# Patient Record
Sex: Female | Born: 1965 | Race: White | Hispanic: No | Marital: Married | Smoking: Never smoker
Health system: Southern US, Community
[De-identification: ages and names within clinical notes are randomized; demographics above are authoritative.]

## PROBLEM LIST (undated history)

## (undated) DIAGNOSIS — M533 Sacrococcygeal disorders, not elsewhere classified: Secondary | ICD-10-CM

## (undated) DIAGNOSIS — N390 Urinary tract infection, site not specified: Secondary | ICD-10-CM

## (undated) DIAGNOSIS — F419 Anxiety disorder, unspecified: Secondary | ICD-10-CM

## (undated) HISTORY — DX: Anxiety disorder, unspecified: F41.9

## (undated) HISTORY — PX: PILONIDAL CYST EXCISION: SHX744

## (undated) HISTORY — PX: BREAST BIOPSY: SHX20

---

## 2005-04-28 ENCOUNTER — Ambulatory Visit: Payer: Self-pay | Admitting: Cardiology

## 2005-05-15 ENCOUNTER — Ambulatory Visit: Payer: Self-pay | Admitting: Cardiology

## 2005-05-26 ENCOUNTER — Ambulatory Visit: Payer: Self-pay | Admitting: Cardiology

## 2005-06-22 ENCOUNTER — Ambulatory Visit: Payer: Self-pay | Admitting: Cardiology

## 2005-06-30 ENCOUNTER — Ambulatory Visit: Payer: Self-pay | Admitting: Cardiology

## 2005-06-30 ENCOUNTER — Inpatient Hospital Stay (HOSPITAL_BASED_OUTPATIENT_CLINIC_OR_DEPARTMENT_OTHER): Admission: RE | Admit: 2005-06-30 | Discharge: 2005-06-30 | Payer: Self-pay | Admitting: Cardiology

## 2005-07-15 ENCOUNTER — Ambulatory Visit: Payer: Self-pay | Admitting: Cardiology

## 2008-07-05 ENCOUNTER — Encounter: Admission: RE | Admit: 2008-07-05 | Discharge: 2008-07-05 | Payer: Self-pay | Admitting: Obstetrics and Gynecology

## 2009-08-30 ENCOUNTER — Encounter: Admission: RE | Admit: 2009-08-30 | Discharge: 2009-08-30 | Payer: Self-pay | Admitting: Obstetrics and Gynecology

## 2010-02-21 ENCOUNTER — Other Ambulatory Visit: Payer: Self-pay | Admitting: Obstetrics and Gynecology

## 2010-02-21 ENCOUNTER — Ambulatory Visit
Admission: RE | Admit: 2010-02-21 | Discharge: 2010-02-21 | Disposition: A | Discharge status: Home / Self Care | Payer: BC Managed Care – PPO | Source: Ambulatory Visit | Attending: Obstetrics and Gynecology | Admitting: Obstetrics and Gynecology

## 2010-02-21 DIAGNOSIS — N644 Mastodynia: Secondary | ICD-10-CM

## 2010-05-23 NOTE — Assessment & Plan Note (Signed)
[facility]                            [facility] OFFICE NOTE   Betty Harvey, Betty Harvey                        MRN:          [id number]  DATE:07/15/2005                            DOB:          1965/02/24    HISTORY OF PRESENT ILLNESS:  The patient is a 45 year old female who  recently underwent cardiac catheterization.  The patient was found to have  normal coronary arteries, normal left ventricular function.  The patient  prior to that had a Cardiolite study done, which appeared in retrospect to  be false/positive.  The initial reason for the Cardiolite was a markedly  elevated C-reactive protein obtained by Dr. Gilford Silvius.  The patient reportedly  had told Dr. Gilford Silvius that she had longstanding history of substernal chest  pain.  The patient states that even after catheterization she keeps having  ongoing chest pain, although it is not exertional.  She states there is a  sharp pain in the left side of the chest which is intermittent.  She denies  any shortness of breath, orthopnea, PND, palpitations, or syncope.   The patient is not currently taking medications.   PHYSICAL EXAMINATION:  VITAL SIGNS:  Stable, with a blood pressure of 82/54,  heart is 80 beats per minute, she weighs 150 pounds.  NECK:  Reveals a normal carotid upstroke and no carotid bruits.  LUNGS:  Clear bilaterally.  CARDIAC:  Reveals regular rate and rhythm.  Normal S1, S2, no murmurs, rubs,  or gallops.  ABDOMEN:  Soft.  EXTREMITIES:  No cyanosis, clubbing, or edema.   PROBLEM LIST:  1.  Elevated C-reactive protein.  2.  False/positive exercise Cardiolite stress study.  3.  Normal catheterization.  4.  Substernal chest pain of  unclear etiology.      1.  Normal chest x-ray with normal osseous structures and soft tissues          in the left chest.   PLAN:  1.  At this point in time, the patient can follow up with her primary care      physician.  I do not think she has a  cardiac etiology to her chest pain.  2.  I did recommend to follow up on her C-reactive protein, but I suspect      this was elevated possibly secondary to an underlying infection or      rather nonspecific reasons.                                  Learta Codding, MD, [facility]   GED/MedQ  DD:  07/16/2005  DT:  07/16/2005  Job #:  [id number]   cc:   Sheela Stack, MD

## 2010-05-23 NOTE — Cardiovascular Report (Signed)
[name], [name]:  [id number]   MEDICAL RECORD NO.:  [id number]          PATIENT TYPE:  OIB   LOCATION:  1962                         FACILITY:  [facility]   PHYSICIAN:  Rollene Rotunda, M.D.   DATE OF BIRTH:  Jan 28, 1965   DATE OF PROCEDURE:  06/30/2005  DATE OF DISCHARGE:                              CARDIAC CATHETERIZATION   PRIMARY:  Betty Harvey.   CARDIOLOGIST:  Willa Rough, M.D.   PROCEDURE:  Left heart catheterization/coronary angiography.   INDICATIONS:  Patient with chest pain and a Cardiolite suggesting anterior  ischemia.   PROCEDURE NOTE:  Left heart catheterization was performed via the right  femoral artery.  The artery was cannulated using anterior wall puncture.  A  #4 French arterial sheath was inserted via the modified Seldinger technique.  Preformed Judkins and pigtail catheter were utilized.  The patient tolerated  the procedure well and left the lab in stable condition.   RESULTS:  Hemodynamics:  LV 98/8, ___________ 16/10.   Coronaries:  The left main was normal.  The LAD was normal.  The circumflex  was essentially a large obtuse marginal which was normal.  The right  coronary artery was a large dominant vessel and normal.  The PDA was very  large and normal.  There was a small posterolateral which was normal.   Left ventriculogram:  The left ventriculogram was obtained in the RAO  projection.  The EF was 65% with normal wall motion.   CONCLUSION:  Normal coronaries.  Normal left ventricular function.   PLAN:  No further cardiac workup is suggested.  The patient will follow up  with her primary care doctor for evaluation of non-anginal chest pain.           ______________________________  Rollene Rotunda, M.D.     JH/MEDQ  D:  06/30/2005  T:  06/30/2005  Job:  [id number]   cc:   Willa Rough, M.D.  [address]  [address]  [place]  [place] [address]   Betty Harvey  Fax: [phone]

## 2010-06-25 ENCOUNTER — Other Ambulatory Visit: Payer: Self-pay | Admitting: Obstetrics and Gynecology

## 2010-06-25 DIAGNOSIS — Z1231 Encounter for screening mammogram for malignant neoplasm of breast: Secondary | ICD-10-CM

## 2010-09-04 ENCOUNTER — Ambulatory Visit: Payer: BC Managed Care – PPO

## 2010-09-18 ENCOUNTER — Ambulatory Visit
Admission: RE | Admit: 2010-09-18 | Discharge: 2010-09-18 | Disposition: A | Discharge status: Home / Self Care | Payer: BC Managed Care – PPO | Source: Ambulatory Visit | Attending: Obstetrics and Gynecology | Admitting: Obstetrics and Gynecology

## 2010-09-18 DIAGNOSIS — Z1231 Encounter for screening mammogram for malignant neoplasm of breast: Secondary | ICD-10-CM

## 2010-09-18 IMAGING — MG MM DIGITAL SCREENING {BCG}
4 series · 4 of 4 positions shown · non-contrast
Comparison: none

DG SCREEN MAMMOGRAM BILATERAL
Bilateral CC and MLO view(s) were taken.
Technologist: GILLERMO

DIGITAL SCREENING MAMMOGRAM WITH CAD:
The breast tissue is heterogeneously dense.  Possible masses are noted in both breasts.  Spot 
compression views and possibly sonography are recommended for further evaluation.  .  Compared with
prior studies.
Images were processed with CAD.

[R CC]
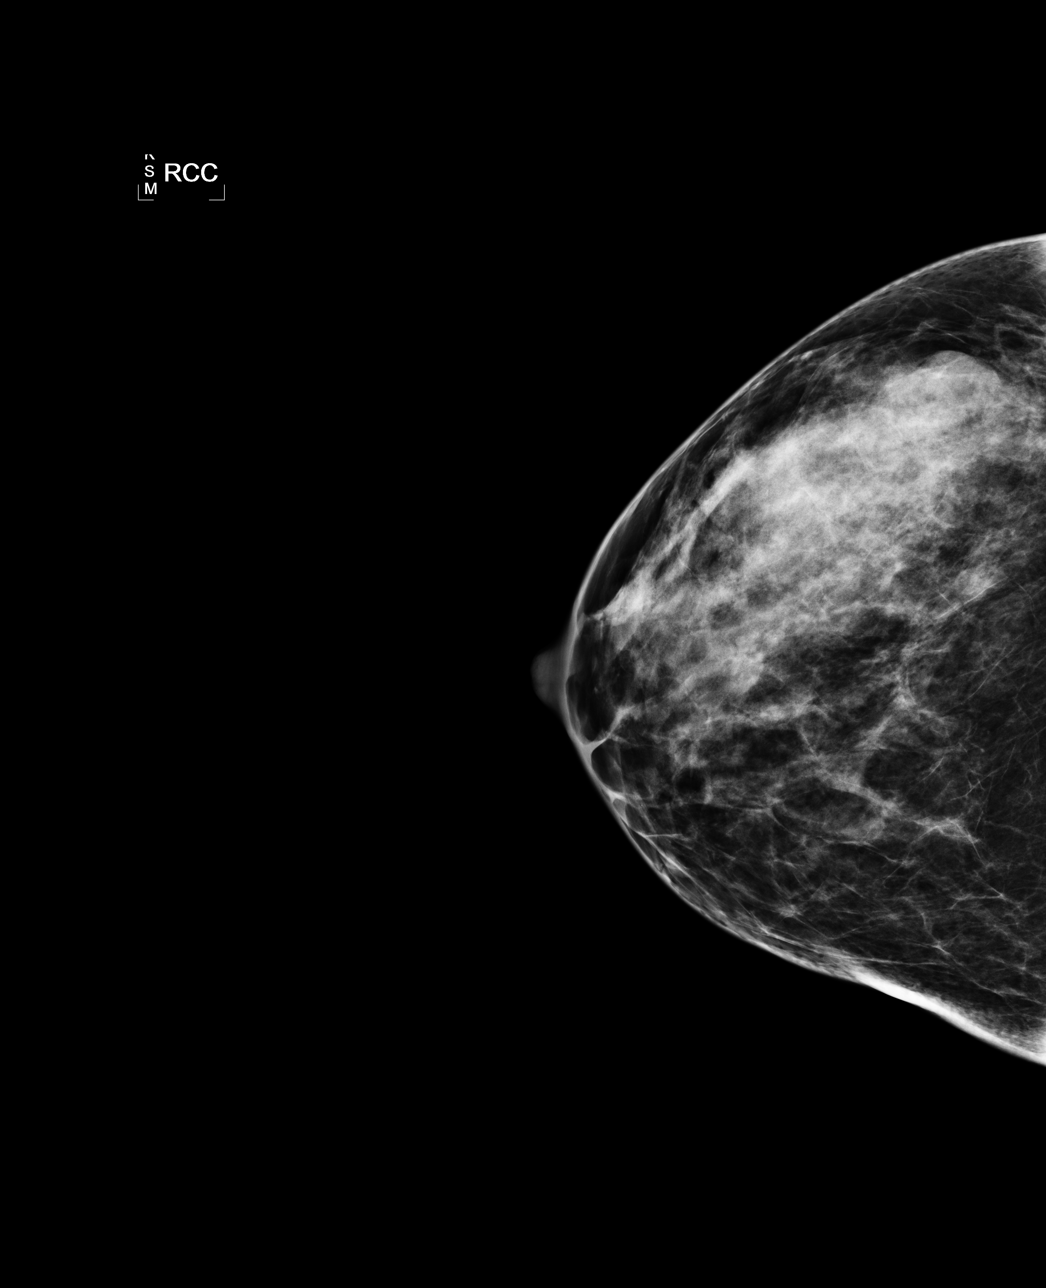

[L CC]
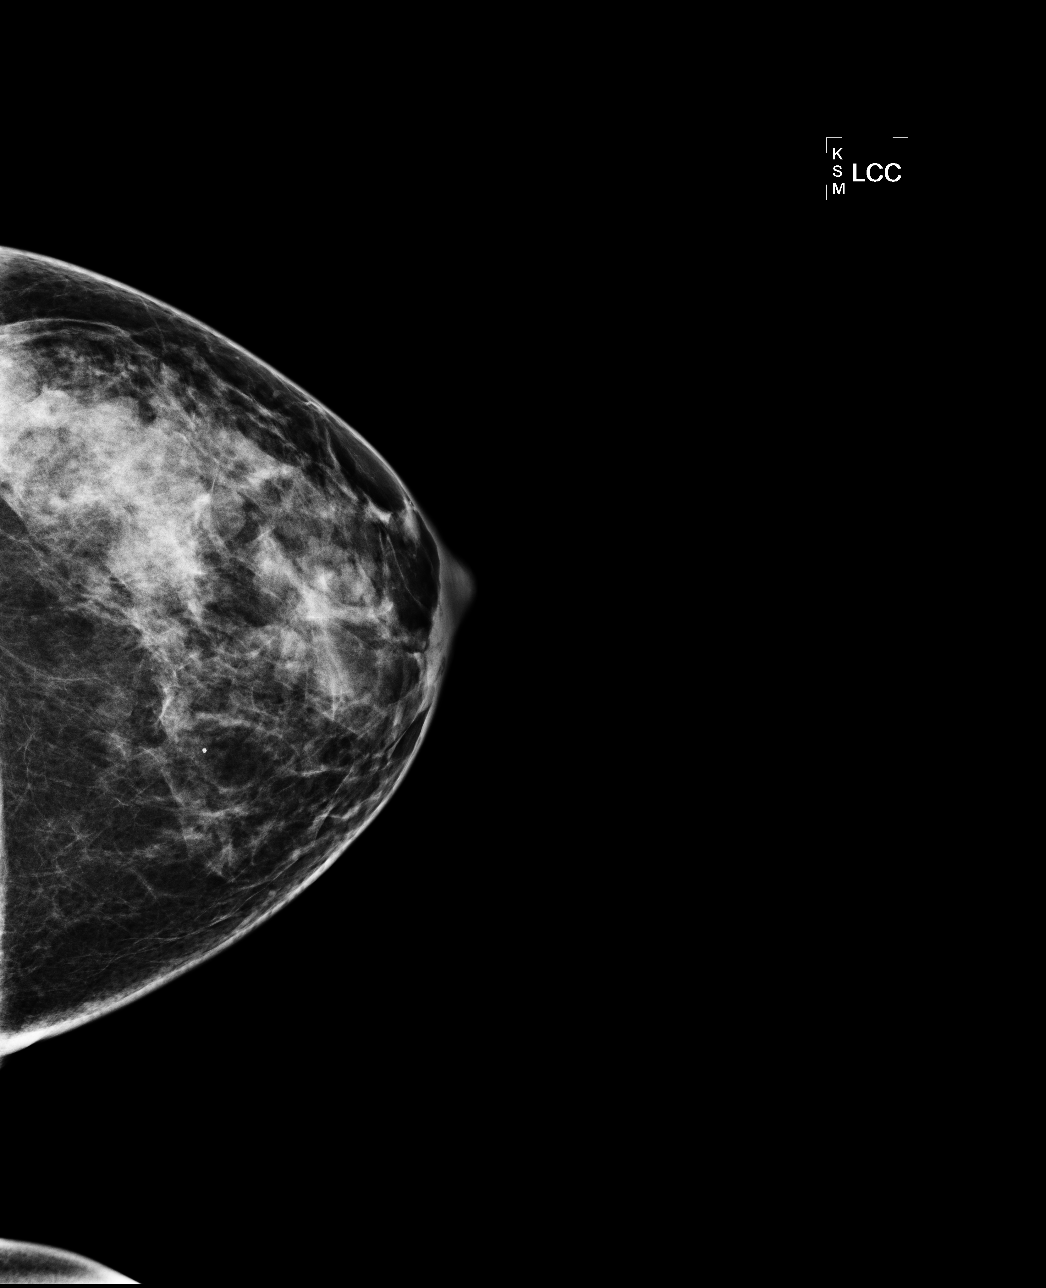

[L MLO]
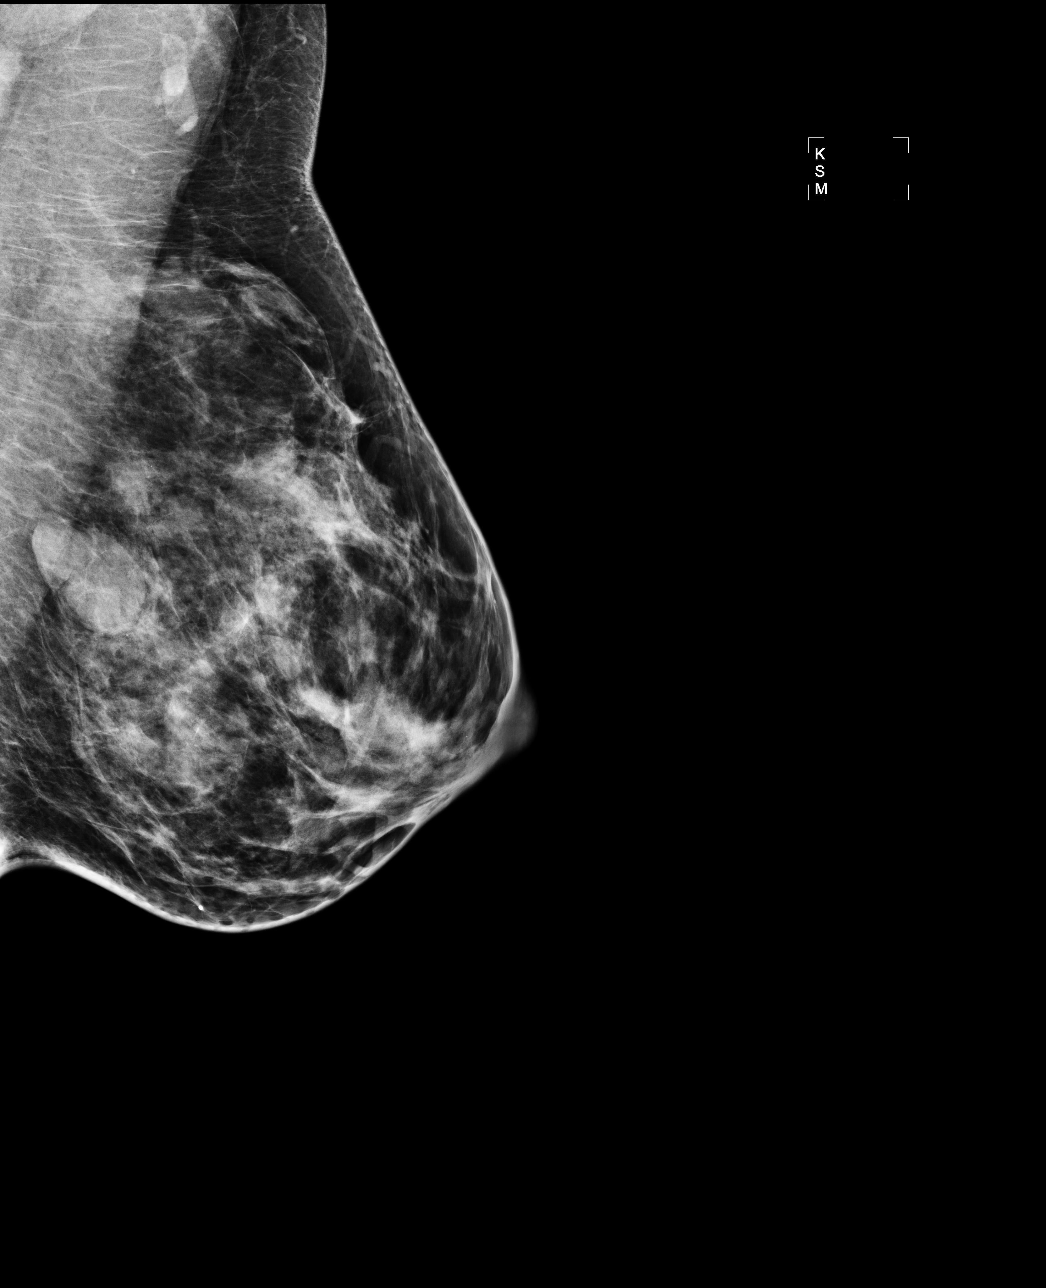

[R MLO]
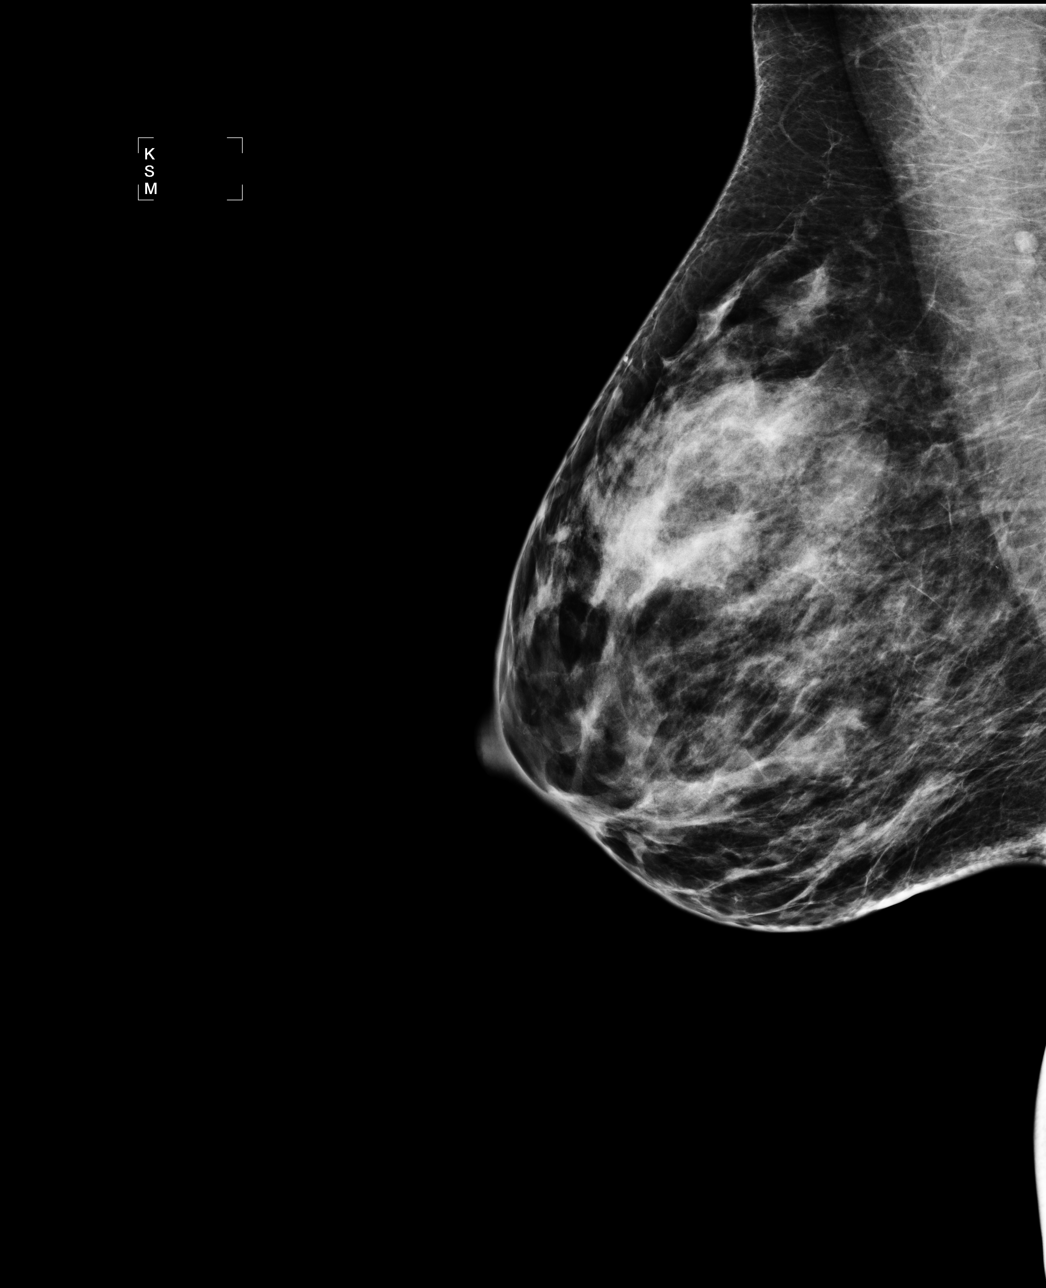

[4 of 4 positions shown; findings below may reference images not displayed]

IMPRESSION: Possible masses, bilateral breasts.  Additional evaluation is indicated.  The patient will be 
contacted for additional studies and a supplementary report will follow.

ASSESSMENT: Need additional imaging evaluation and/or prior mammograms for comparison - BI-RADS 0

Further imaging of both breasts.
,

## 2010-09-24 ENCOUNTER — Other Ambulatory Visit: Payer: Self-pay | Admitting: Obstetrics and Gynecology

## 2010-09-24 DIAGNOSIS — R928 Other abnormal and inconclusive findings on diagnostic imaging of breast: Secondary | ICD-10-CM

## 2010-10-06 ENCOUNTER — Ambulatory Visit
Admission: RE | Admit: 2010-10-06 | Discharge: 2010-10-06 | Disposition: A | Discharge status: Home / Self Care | Payer: BC Managed Care – PPO | Source: Ambulatory Visit | Attending: Obstetrics and Gynecology | Admitting: Obstetrics and Gynecology

## 2010-10-06 DIAGNOSIS — R928 Other abnormal and inconclusive findings on diagnostic imaging of breast: Secondary | ICD-10-CM

## 2011-09-01 ENCOUNTER — Other Ambulatory Visit: Payer: Self-pay | Admitting: Obstetrics and Gynecology

## 2011-09-01 DIAGNOSIS — Z1231 Encounter for screening mammogram for malignant neoplasm of breast: Secondary | ICD-10-CM

## 2011-09-22 ENCOUNTER — Ambulatory Visit
Admission: RE | Admit: 2011-09-22 | Discharge: 2011-09-22 | Disposition: A | Discharge status: Home / Self Care | Payer: BC Managed Care – PPO | Source: Ambulatory Visit | Attending: Obstetrics and Gynecology | Admitting: Obstetrics and Gynecology

## 2011-09-22 DIAGNOSIS — Z1231 Encounter for screening mammogram for malignant neoplasm of breast: Secondary | ICD-10-CM

## 2011-12-01 ENCOUNTER — Other Ambulatory Visit: Payer: Self-pay | Admitting: Obstetrics and Gynecology

## 2011-12-01 ENCOUNTER — Ambulatory Visit
Admission: RE | Admit: 2011-12-01 | Discharge: 2011-12-01 | Disposition: A | Discharge status: Home / Self Care | Payer: BC Managed Care – PPO | Source: Ambulatory Visit | Attending: Obstetrics and Gynecology | Admitting: Obstetrics and Gynecology

## 2011-12-01 DIAGNOSIS — N6009 Solitary cyst of unspecified breast: Secondary | ICD-10-CM

## 2012-07-11 ENCOUNTER — Other Ambulatory Visit: Payer: Self-pay

## 2012-07-11 DIAGNOSIS — Z1231 Encounter for screening mammogram for malignant neoplasm of breast: Secondary | ICD-10-CM

## 2012-09-27 ENCOUNTER — Ambulatory Visit
Admission: RE | Admit: 2012-09-27 | Discharge: 2012-09-27 | Disposition: A | Discharge status: Home / Self Care | Payer: BC Managed Care – PPO | Source: Ambulatory Visit

## 2012-09-27 DIAGNOSIS — Z1231 Encounter for screening mammogram for malignant neoplasm of breast: Secondary | ICD-10-CM

## 2012-09-29 ENCOUNTER — Other Ambulatory Visit: Payer: Self-pay | Admitting: Obstetrics and Gynecology

## 2012-09-29 DIAGNOSIS — R928 Other abnormal and inconclusive findings on diagnostic imaging of breast: Secondary | ICD-10-CM

## 2012-10-06 ENCOUNTER — Ambulatory Visit
Admission: RE | Admit: 2012-10-06 | Discharge: 2012-10-06 | Disposition: A | Discharge status: Home / Self Care | Payer: BC Managed Care – PPO | Source: Ambulatory Visit | Attending: Obstetrics and Gynecology | Admitting: Obstetrics and Gynecology

## 2012-10-06 DIAGNOSIS — R928 Other abnormal and inconclusive findings on diagnostic imaging of breast: Secondary | ICD-10-CM

## 2013-06-09 ENCOUNTER — Other Ambulatory Visit: Payer: Self-pay

## 2013-06-09 DIAGNOSIS — Z1231 Encounter for screening mammogram for malignant neoplasm of breast: Secondary | ICD-10-CM

## 2013-09-28 ENCOUNTER — Ambulatory Visit
Admission: RE | Admit: 2013-09-28 | Discharge: 2013-09-28 | Disposition: A | Discharge status: Home / Self Care | Payer: BC Managed Care – PPO | Source: Ambulatory Visit

## 2013-09-28 DIAGNOSIS — Z1231 Encounter for screening mammogram for malignant neoplasm of breast: Secondary | ICD-10-CM

## 2013-11-24 ENCOUNTER — Institutional Professional Consult (permissible substitution): Payer: BC Managed Care – PPO | Admitting: Internal Medicine

## 2013-12-26 ENCOUNTER — Encounter (HOSPITAL_COMMUNITY): Payer: Self-pay

## 2014-01-08 ENCOUNTER — Institutional Professional Consult (permissible substitution): Payer: BC Managed Care – PPO | Admitting: Internal Medicine

## 2014-01-10 ENCOUNTER — Ambulatory Visit (HOSPITAL_COMMUNITY)
Admission: RE | Admit: 2014-01-10 | Discharge: 2014-01-10 | Disposition: A | Discharge status: Home / Self Care | Payer: BLUE CROSS/BLUE SHIELD | Source: Ambulatory Visit | Attending: Obstetrics and Gynecology | Admitting: Obstetrics and Gynecology

## 2014-01-10 ENCOUNTER — Ambulatory Visit (HOSPITAL_COMMUNITY): Payer: BLUE CROSS/BLUE SHIELD | Admitting: Anesthesiology

## 2014-01-10 ENCOUNTER — Encounter (HOSPITAL_COMMUNITY): Payer: Self-pay | Admitting: *Deleted

## 2014-01-10 ENCOUNTER — Encounter (HOSPITAL_COMMUNITY)
Admission: RE | Disposition: A | Discharge status: Home / Self Care | Payer: Self-pay | Source: Ambulatory Visit | Attending: Obstetrics and Gynecology

## 2014-01-10 DIAGNOSIS — Z641 Problems related to multiparity: Secondary | ICD-10-CM | POA: Insufficient documentation

## 2014-01-10 DIAGNOSIS — N840 Polyp of corpus uteri: Secondary | ICD-10-CM | POA: Insufficient documentation

## 2014-01-10 DIAGNOSIS — N920 Excessive and frequent menstruation with regular cycle: Secondary | ICD-10-CM | POA: Diagnosis present

## 2014-01-10 DIAGNOSIS — Z87440 Personal history of urinary (tract) infections: Secondary | ICD-10-CM | POA: Diagnosis not present

## 2014-01-10 HISTORY — PX: HYSTEROSCOPY WITH D & C: SHX1775

## 2014-01-10 HISTORY — DX: Urinary tract infection, site not specified: N39.0

## 2014-01-10 HISTORY — DX: Sacrococcygeal disorders, not elsewhere classified: M53.3

## 2014-01-10 LAB — CBC
HCT: 36.1 % (ref 36.0–46.0)
Hemoglobin: 12.2 g/dL (ref 12.0–15.0)
MCH: 29.8 pg (ref 26.0–34.0)
MCHC: 33.8 g/dL (ref 30.0–36.0)
MCV: 88.3 fL (ref 78.0–100.0)
PLATELETS: 248 10*3/uL (ref 150–400)
RBC: 4.09 MIL/uL (ref 3.87–5.11)
RDW: 12.3 % (ref 11.5–15.5)
WBC: 4.9 10*3/uL (ref 4.0–10.5)

## 2014-01-10 LAB — PREGNANCY, URINE: PREG TEST UR: NEGATIVE

## 2014-01-10 SURGERY — DILATATION AND CURETTAGE /HYSTEROSCOPY
Anesthesia: General | Site: Uterus

## 2014-01-10 MED ORDER — KETOROLAC TROMETHAMINE 30 MG/ML IJ SOLN: 30.0000 mg | Freq: Once | INTRAMUSCULAR | Status: DC | PRN

## 2014-01-10 MED ORDER — ONDANSETRON HCL 4 MG/2ML IJ SOLN
INTRAMUSCULAR | Status: AC
  Filled 2014-01-10: qty 2

## 2014-01-10 MED ORDER — CEFAZOLIN SODIUM-DEXTROSE 2-3 GM-% IV SOLR: 2.0000 g | INTRAVENOUS | Status: DC

## 2014-01-10 MED ORDER — LACTATED RINGERS IV SOLN
INTRAVENOUS | Status: DC
  Administered 2014-01-10: 12:00:00 via INTRAVENOUS

## 2014-01-10 MED ORDER — KETOROLAC TROMETHAMINE 30 MG/ML IJ SOLN
INTRAMUSCULAR | Status: DC | PRN
  Administered 2014-01-10: 30 mg via INTRAVENOUS

## 2014-01-10 MED ORDER — ONDANSETRON HCL 4 MG/2ML IJ SOLN
INTRAMUSCULAR | Status: DC | PRN
Start: 2014-01-10 — End: 2014-01-10
  Administered 2014-01-10: 4 mg via INTRAVENOUS

## 2014-01-10 MED ORDER — MIDAZOLAM HCL 2 MG/2ML IJ SOLN
INTRAMUSCULAR | Status: DC | PRN
  Administered 2014-01-10: 1 mg via INTRAVENOUS

## 2014-01-10 MED ORDER — KETOROLAC TROMETHAMINE 30 MG/ML IJ SOLN
INTRAMUSCULAR | Status: AC
  Filled 2014-01-10: qty 1

## 2014-01-10 MED ORDER — CEFAZOLIN SODIUM-DEXTROSE 2-3 GM-% IV SOLR
INTRAVENOUS | Status: AC
  Administered 2014-01-10: 2 g via INTRAVENOUS
  Filled 2014-01-10: qty 50

## 2014-01-10 MED ORDER — DEXAMETHASONE SODIUM PHOSPHATE 4 MG/ML IJ SOLN
INTRAMUSCULAR | Status: DC | PRN
  Administered 2014-01-10: 4 mg via INTRAVENOUS

## 2014-01-10 MED ORDER — LIDOCAINE HCL (CARDIAC) 20 MG/ML IV SOLN
INTRAVENOUS | Status: DC | PRN
  Administered 2014-01-10: 60 mg via INTRAVENOUS

## 2014-01-10 MED ORDER — PROPOFOL 10 MG/ML IV BOLUS
INTRAVENOUS | Status: AC
  Filled 2014-01-10: qty 20

## 2014-01-10 MED ORDER — MIDAZOLAM HCL 2 MG/2ML IJ SOLN: 0.5000 mg | Freq: Once | INTRAMUSCULAR | Status: DC | PRN

## 2014-01-10 MED ORDER — LACTATED RINGERS IV SOLN
INTRAVENOUS | Status: DC
  Administered 2014-01-10: 15:00:00 via INTRAVENOUS

## 2014-01-10 MED ORDER — PROPOFOL 10 MG/ML IV BOLUS
INTRAVENOUS | Status: DC | PRN
  Administered 2014-01-10: 200 mg via INTRAVENOUS

## 2014-01-10 MED ORDER — LIDOCAINE HCL 1 % IJ SOLN
INTRAMUSCULAR | Status: AC
  Filled 2014-01-10: qty 20

## 2014-01-10 MED ORDER — MEPERIDINE HCL 25 MG/ML IJ SOLN: 6.2500 mg | INTRAMUSCULAR | Status: DC | PRN

## 2014-01-10 MED ORDER — DEXAMETHASONE SODIUM PHOSPHATE 4 MG/ML IJ SOLN
INTRAMUSCULAR | Status: AC
Start: 2014-01-10 — End: 2014-01-10
  Filled 2014-01-10: qty 1

## 2014-01-10 MED ORDER — MIDAZOLAM HCL 2 MG/2ML IJ SOLN
INTRAMUSCULAR | Status: AC
  Filled 2014-01-10: qty 2

## 2014-01-10 MED ORDER — FENTANYL CITRATE 0.05 MG/ML IJ SOLN
INTRAMUSCULAR | Status: AC
  Filled 2014-01-10: qty 5

## 2014-01-10 MED ORDER — ACETAMINOPHEN 160 MG/5ML PO SOLN: 325.0000 mg | ORAL | Status: DC | PRN

## 2014-01-10 MED ORDER — LIDOCAINE HCL 1 % IJ SOLN
INTRAMUSCULAR | Status: DC | PRN
  Administered 2014-01-10: 20 mL

## 2014-01-10 MED ORDER — FENTANYL CITRATE 0.05 MG/ML IJ SOLN: 25.0000 ug | INTRAMUSCULAR | Status: DC | PRN

## 2014-01-10 MED ORDER — SCOPOLAMINE 1 MG/3DAYS TD PT72
MEDICATED_PATCH | TRANSDERMAL | Status: AC
  Administered 2014-01-10: 1.5 mg via TRANSDERMAL
  Filled 2014-01-10: qty 1

## 2014-01-10 MED ORDER — ACETAMINOPHEN 325 MG PO TABS: 325.0000 mg | ORAL_TABLET | ORAL | Status: DC | PRN

## 2014-01-10 MED ORDER — PROMETHAZINE HCL 25 MG/ML IJ SOLN: 6.2500 mg | INTRAMUSCULAR | Status: DC | PRN

## 2014-01-10 MED ORDER — FENTANYL CITRATE 0.05 MG/ML IJ SOLN
INTRAMUSCULAR | Status: DC | PRN
  Administered 2014-01-10 (×2): 50 ug via INTRAVENOUS

## 2014-01-10 MED ORDER — SCOPOLAMINE 1 MG/3DAYS TD PT72
1.0000 | MEDICATED_PATCH | Freq: Once | TRANSDERMAL | Status: DC
  Administered 2014-01-10: 1.5 mg via TRANSDERMAL

## 2014-01-10 MED ORDER — LIDOCAINE HCL (CARDIAC) 20 MG/ML IV SOLN
INTRAVENOUS | Status: AC
  Filled 2014-01-10: qty 5

## 2014-01-10 SURGICAL SUPPLY — 16 items
CANISTER SUCT 3000ML (MISCELLANEOUS) ×3 IMPLANT
CATH ROBINSON RED A/P 16FR (CATHETERS) ×3 IMPLANT
CLOTH BEACON ORANGE TIMEOUT ST (SAFETY) ×3 IMPLANT
CONTAINER PREFILL 10% NBF 60ML (FORM) ×6 IMPLANT
ELECT REM PT RETURN 9FT ADLT (ELECTROSURGICAL)
ELECTRODE REM PT RTRN 9FT ADLT (ELECTROSURGICAL) IMPLANT
GLOVE BIO SURGEON STRL SZ 6.5 (GLOVE) ×2 IMPLANT
GLOVE BIO SURGEONS STRL SZ 6.5 (GLOVE) ×1
GOWN STRL REUS W/TWL LRG LVL3 (GOWN DISPOSABLE) ×6 IMPLANT
LOOP ANGLED CUTTING 22FR (CUTTING LOOP) IMPLANT
PACK VAGINAL MINOR WOMEN LF (CUSTOM PROCEDURE TRAY) ×3 IMPLANT
PAD OB MATERNITY 4.3X12.25 (PERSONAL CARE ITEMS) ×3 IMPLANT
TOWEL OR 17X24 6PK STRL BLUE (TOWEL DISPOSABLE) ×6 IMPLANT
TUBING AQUILEX INFLOW (TUBING) ×3 IMPLANT
TUBING AQUILEX OUTFLOW (TUBING) ×3 IMPLANT
WATER STERILE IRR 1000ML POUR (IV SOLUTION) ×3 IMPLANT

## 2014-01-10 NOTE — Discharge Instructions (Signed)
DISCHARGE INSTRUCTIONS: D&C  The following instructions have been prepared to help you care for yourself upon your return home.  MAY TAKE IBUPROFEN (MOTRIN, ADVIL) OR ALEVE AFTER 8:00 PM FOR PAIN!!! CAN REMOVE PATCH BEHIND EAR IN 48 HOURS!   Personal hygiene:  Use sanitary pads for vaginal drainage, not tampons.  Shower the day after your procedure.  NO tub baths, pools or Jacuzzis for 2-3 weeks.  Wipe front to back after using the bathroom.  Activity and limitations:  Do NOT drive or operate any equipment for 24 hours. The effects of anesthesia are still present and drowsiness may result.  Do NOT rest in bed all day.  Walking is encouraged.  Walk up and down stairs slowly.  You may resume your normal activity in one to two days or as indicated by your physician.  Sexual activity: NO intercourse for at least 2 weeks after the procedure, or as indicated by your physician.  Diet: Eat a light meal as desired this evening. You may resume your usual diet tomorrow.  Return to work: You may resume your work activities in one to two days or as indicated by your doctor.  What to expect after your surgery: Expect to have vaginal bleeding/discharge for 2-3 days and spotting for up to 10 days. It is not unusual to have soreness for up to 1-2 weeks. You may have a slight burning sensation when you urinate for the first day. Mild cramps may continue for a couple of days. You may have a regular period in 2-6 weeks.  Call your doctor for any of the following:  Excessive vaginal bleeding, saturating and changing one pad every hour.  Inability to urinate 6 hours after discharge from hospital.  Pain not relieved by pain medication.  Fever of 100.4 F or greater.  Unusual vaginal discharge or odor.   Call for an appointment:    Patients signature: ______________________  Nurses signature ________________________  Support person's signature_______________________

## 2014-01-10 NOTE — Anesthesia Preprocedure Evaluation (Addendum)
Anesthesia Evaluation  Patient identified by MRN, date of birth, ID band Patient awake    Reviewed: Allergy & Precautions, H&P , Patient's Chart, lab work & pertinent test results, reviewed documented beta blocker date and time   History of Anesthesia Complications Negative for: history of anesthetic complications  Airway Mallampati: II  TM Distance: >3 FB Neck ROM: full    Dental   Pulmonary  breath sounds clear to auscultation        Cardiovascular Exercise Tolerance: Good Rhythm:regular Rate:Normal     Neuro/Psych negative psych ROS   GI/Hepatic   Endo/Other    Renal/GU      Musculoskeletal   Abdominal   Peds  Hematology   Anesthesia Other Findings SI pain  Reproductive/Obstetrics                             Anesthesia Physical Anesthesia Plan  ASA: II  Anesthesia Plan: General LMA   Post-op Pain Management:    Induction:   Airway Management Planned:   Additional Equipment:   Intra-op Plan:   Post-operative Plan:   Informed Consent: I have reviewed the patients History and Physical, chart, labs and discussed the procedure including the risks, benefits and alternatives for the proposed anesthesia with the patient or authorized representative who has indicated his/her understanding and acceptance.   Dental Advisory Given  Plan Discussed with: CRNA, Surgeon and Anesthesiologist  Anesthesia Plan Comments:        Anesthesia Quick Evaluation

## 2014-01-10 NOTE — Transfer of Care (Signed)
Immediate Anesthesia Transfer of Care Note  Patient: Betty BrakemanAngelia R Harvey  Procedure(s) Performed: Procedure(s): DILATATION AND CURETTAGE /HYSTEROSCOPY (N/A)  Patient Location: PACU  Anesthesia Type:General  Level of Consciousness: awake, alert  and oriented  Airway & Oxygen Therapy: Patient Spontanous Breathing and Patient connected to nasal cannula oxygen  Post-op Assessment: Report given to PACU RN and Post -op Vital signs reviewed and stable  Post vital signs: Reviewed and stable  Complications: No apparent anesthesia complications

## 2014-01-10 NOTE — H&P (Signed)
Betty Harvey is an 49 y.o. G 3 P 2 husband has had vasectomy here for removal of endometrial mass.  Called on December 14th with heavy, irregular bleeding. Ultrasound/[facility] on December 21 revealed 1.9 cm polypoid mass in the endometrial cavity.  Pertinent Gynecological History: Menses: flow is excessive with use of unknown amount  pads or tampons on heaviest days Bleeding: intermenstrual bleeding Contraception: vasectomy DES exposure: denies Blood transfusions: none Sexually transmitted diseases: no past history Previous GYN Procedures: none  Last mammogram: normal Date: 2015 Last pap: normal Date: 2015 OB History: G3, P2   Menstrual History: Menarche age: unknown  Patient's last menstrual period was 12/08/2013.    Past Medical History  Diagnosis Date  . SI (sacroiliac) pain   . UTI (lower urinary tract infection)     Past Surgical History  Procedure Laterality Date  . Pilonidal cyst excision      History reviewed. No pertinent family history.  Social History:  reports that she has never smoked. She has never used smokeless tobacco. She reports that she does not drink alcohol or use illicit drugs.  Allergies: No Known Allergies  No prescriptions prior to admission    Review of Systems  All other systems reviewed and are negative.   Height 5\' 7"  (1.702 m), weight 68.947 kg (152 lb), last menstrual period 12/08/2013. Physical Exam  Nursing note and vitals reviewed. Constitutional: She appears well-developed.  HENT:  Head: Normocephalic and atraumatic.  Eyes: Pupils are equal, round, and reactive to light.  Neck: Normal range of motion.  Cardiovascular: Normal rate and regular rhythm.   Respiratory: Effort normal.  GI: Soft.  Genitourinary: Vagina normal.    No results found for this or any previous visit (from the past 24 hour(s)).  No results found.  Assessment/Plan: Endometrial mass - probable endometrial polyp D and C, Hysteroscopy and removal of  endometrial mass Risks reviewed  Consent signed Malini Flemings L 01/10/2014, 10:31 AM

## 2014-01-10 NOTE — Anesthesia Postprocedure Evaluation (Signed)
Anesthesia Post Note  Patient: Betty BrakemanAngelia R Harvey  Procedure(s) Performed: Procedure(s) (LRB): DILATATION AND CURETTAGE /HYSTEROSCOPY (N/A)  Anesthesia type: GA  Patient location: PACU  Post pain: Pain level controlled  Post assessment: Post-op Vital signs reviewed  Last Vitals:  Filed Vitals:   01/10/14 1445  BP: 109/62  Pulse: 77  Temp:   Resp: 20    Post vital signs: Reviewed  Level of consciousness: sedated  Complications: No apparent anesthesia complications

## 2014-01-10 NOTE — Brief Op Note (Signed)
01/10/2014  1:56 PM  PATIENT:  Irwin BrakemanAngelia R Granderson  49 y.o. female  PRE-OPERATIVE DIAGNOSIS:  Menorrhagia, Endometrial Polyp  POST-OPERATIVE DIAGNOSIS:  Same  PROCEDURE:  Procedure(s): DILATATION AND CURETTAGE /HYSTEROSCOPY (N/A)  SURGEON:  Surgeon(s) and Role:    * Jeani HawkingMichelle L Sheralyn Pinegar, MD - Primary  PHYSICIAN ASSISTANT:   ASSISTANTS: none   ANESTHESIA:   paracervical block and MAC  EBL:  Total I/O In: -  Out: 175 [Urine:150; Blood:25]  BLOOD ADMINISTERED:none  DRAINS: none   LOCAL MEDICATIONS USED:  LIDOCAINE   SPECIMEN:  Source of Specimen:  uterine curretings  DISPOSITION OF SPECIMEN:  PATHOLOGY  COUNTS:  YES  TOURNIQUET:  * No tourniquets in log *  DICTATION: .Other Dictation: Dictation Number [id number]  PLAN OF CARE: Discharge to home after PACU  PATIENT DISPOSITION:  PACU - hemodynamically stable.   Delay start of Pharmacological VTE agent (>24hrs) due to surgical blood loss or risk of bleeding: not applicable

## 2014-01-10 NOTE — Progress Notes (Signed)
H and P on chart No changes Will proceed with D and C Hysteroscopy Polyp Resection Consent signed

## 2014-01-11 ENCOUNTER — Encounter (HOSPITAL_COMMUNITY): Payer: Self-pay | Admitting: Obstetrics and Gynecology

## 2014-01-11 NOTE — Op Note (Signed)
NAMSynetta Fail:  Wilmer, Larkyn               ACCOUNT NO.:  [id number]  MEDICAL RECORD NO.:  [id number]  LOCATION:  [facility]                          FACILITY:  [facility]  PHYSICIAN:  Rozell Kettlewell L. Juri Dinning, M.D.DATE OF BIRTH:  18-Feb-1965  DATE OF PROCEDURE:  01/10/2014 DATE OF DISCHARGE:  01/10/2014                              OPERATIVE REPORT   PREOPERATIVE DIAGNOSIS:  Menorrhagia and endometrial polyp.  POSTOPERATIVE DIAGNOSIS:  Menorrhagia and endometrial polyp.  PROCEDURE:  D and C, hysteroscopy, polyp resection.  SURGEON:  Israel Werts L. Vincente PoliGrewal, M.D.  ANESTHESIA:  MAC with paracervical block.  EBL:  Minimal.  COMPLICATIONS:  None.  PATHOLOGY:  Uterine curettings.  PROCEDURE IN DETAIL:  The patient was taken to the operating room after consent was obtained.  She was then prepped and draped in usual sterile fashion.  An in-and-out catheter was used to empty the bladder.  The cervix was grasped with a tenaculum.  Paracervical block was performed. The cervical internal os was gently dilated using Pratt dilators. Diagnostic hysteroscope was inserted into the uterine cavity with excellent visualization.  I could see that she had some polypoid areas noted and it looked like she had an elongated polyp arising from the lower uterine segment down that was floating in the cervix.  I removed the hysteroscope, inserted a sharp curette and curetted the uterus and the cervix, cervical canal thoroughly 3 times.  I inserted polyp forceps and retrieved tissue that did appear polypoid in appearance.  At the end of the procedure, there was no bleeding noted.  All sponge, lap, and instrument counts were correct x2.  The patient went to recovery room in stable condition.     Elisabella Hacker L. Vincente PoliGrewal, M.D.     Florestine AversMLG/MEDQ  D:  01/10/2014  T:  01/11/2014  Job:  [id number]

## 2014-01-19 ENCOUNTER — Inpatient Hospital Stay (HOSPITAL_COMMUNITY)
Admission: AD | Admit: 2014-01-19 | Discharge: 2014-01-19 | Disposition: A | Discharge status: Home / Self Care | Payer: BLUE CROSS/BLUE SHIELD | Source: Ambulatory Visit | Attending: Obstetrics and Gynecology | Admitting: Obstetrics and Gynecology

## 2014-01-19 ENCOUNTER — Encounter (HOSPITAL_COMMUNITY): Payer: Self-pay | Admitting: Advanced Practice Midwife

## 2014-01-19 DIAGNOSIS — N719 Inflammatory disease of uterus, unspecified: Secondary | ICD-10-CM | POA: Diagnosis not present

## 2014-01-19 DIAGNOSIS — B9689 Other specified bacterial agents as the cause of diseases classified elsewhere: Secondary | ICD-10-CM | POA: Diagnosis not present

## 2014-01-19 DIAGNOSIS — T8189XA Other complications of procedures, not elsewhere classified, initial encounter: Secondary | ICD-10-CM | POA: Diagnosis not present

## 2014-01-19 DIAGNOSIS — Y849 Medical procedure, unspecified as the cause of abnormal reaction of the patient, or of later complication, without mention of misadventure at the time of the procedure: Secondary | ICD-10-CM | POA: Insufficient documentation

## 2014-01-19 DIAGNOSIS — N939 Abnormal uterine and vaginal bleeding, unspecified: Secondary | ICD-10-CM | POA: Diagnosis present

## 2014-01-19 LAB — CBC
HEMATOCRIT: 33.9 % — AB (ref 36.0–46.0)
HEMOGLOBIN: 11.6 g/dL — AB (ref 12.0–15.0)
MCH: 29.9 pg (ref 26.0–34.0)
MCHC: 34.2 g/dL (ref 30.0–36.0)
MCV: 87.4 fL (ref 78.0–100.0)
PLATELETS: 201 10*3/uL (ref 150–400)
RBC: 3.88 MIL/uL (ref 3.87–5.11)
RDW: 12.2 % (ref 11.5–15.5)
WBC: 18.3 10*3/uL — AB (ref 4.0–10.5)

## 2014-01-19 MED ORDER — AMOXICILLIN-POT CLAVULANATE 875-125 MG PO TABS: 1.0000 | ORAL_TABLET | Freq: Two times a day (BID) | ORAL | Status: DC

## 2014-01-19 NOTE — MAU Note (Signed)
PT  SAYS  SHE  HAD D/C  LAST WED-  ALL  GOOD.   THEN  Tuesday  STARTED  BLEEDING   HEAVY,  THEN    Thursday AM  AT WORK-  STARTED FEELING  BAD  WITH CRAMPS -  ATWORK  - VAG  BLEEDING   STOPPPED.      AT 9PM- TOOK TRAMADOL.      AT 1030PM-  TOOK IBUPREOFEN.        PAD ON IN   TRIAGE-     SMALL  AMT  LIGHT  RED.

## 2014-01-19 NOTE — MAU Provider Note (Signed)
History     CSN: [id number]  Arrival date and time: 01/19/14 0042   None     No chief complaint on file.  HPI This is a 49 y.o. female who presents with c/o heavy vaginal bleeding on Tuesday which subsided then got heavy again today. Has been cramping worse today. Had D&C 9 days ago for polyp.   RN Note:  Expand All Collapse All   PT SAYS SHE HAD D/C LAST WED- ALL GOOD. THEN Tuesday STARTED BLEEDING HEAVY, THEN Thursday AM AT WORK- STARTED FEELING BAD WITH CRAMPS - ATWORK - VAG BLEEDING STOPPPED. AT 9PM- TOOK TRAMADOL. AT 1030PM- TOOK IBUPREOFEN. PAD ON IN TRIAGE- SMALL AMT LIGHT RED.        OB History    No data available      Past Medical History  Diagnosis Date  . SI (sacroiliac) pain   . UTI (lower urinary tract infection)   . Vaginal delivery 1997, 1999    Past Surgical History  Procedure Laterality Date  . Pilonidal cyst excision    . Hysteroscopy w/d&c N/A 01/10/2014    Procedure: DILATATION AND CURETTAGE /HYSTEROSCOPY;  Surgeon: Jeani Hawking, MD;  Location: [facility];  Service: Gynecology;  Laterality: N/A;    No family history on file.  History  Substance Use Topics  . Smoking status: Never Smoker   . Smokeless tobacco: Never Used  . Alcohol Use: No    Allergies: No Known Allergies  Prescriptions prior to admission  Medication Sig Dispense Refill Last Dose  . guaiFENesin (MUCINEX) 600 MG 12 hr tablet Take 600 mg by mouth 2 (two) times daily.   01/08/14  . ibuprofen (ADVIL,MOTRIN) 800 MG tablet Take 800 mg by mouth every 8 (eight) hours as needed for mild pain.   Past Week at Unknown time  . Multiple Vitamin (MULTIVITAMIN WITH MINERALS) TABS tablet Take 1 tablet by mouth daily.   More than a month at Unknown time    Review of Systems  Constitutional: Positive for malaise/fatigue. Negative for fever and chills.  Gastrointestinal: Positive for abdominal pain. Negative for nausea and vomiting.   Genitourinary: Negative for dysuria.  Neurological: Positive for weakness.   Physical Exam   Blood pressure 107/54, pulse 105, temperature 98.8 F (37.1 C), temperature source Oral, resp. rate 20, height  (1.676 m), weight 153 lb 2 oz (69.457 kg), last menstrual period 12/08/2013.  Physical Exam  Constitutional: She is oriented to person, place, and time. She appears well-developed and well-nourished. No distress (pale, looks ill).  HENT:  Head: Normocephalic.  Cardiovascular: Normal rate.   Respiratory: Effort normal.  GI: Soft. She exhibits no distension and no mass. There is tenderness (uterus tender). There is no rebound and no guarding.  Genitourinary: Vaginal discharge (moderate clotted blood in vault, no active hemorrhage, cervix closed, + CMT) found.  Musculoskeletal: Normal range of motion.  Neurological: She is alert and oriented to person, place, and time.  Skin: Skin is warm and dry.  Psychiatric: She has a normal mood and affect.    MAU Course  Procedures  MDM  Previous :  Results for ESTEFANY, GOEBEL (MRN [id number]) as of 01/19/2014 02:07  Ref. Range 01/10/2014 11:43  WBC Latest Range: 4.0-10.5 K/uL 4.9  RBC Latest Range: 3.87-5.11 MIL/uL 4.09  Hemoglobin Latest Range: 12.0-15.0 g/dL 66.4      Today:   Results for ADELAIDA, REINDEL (MRN [id number]) as of 01/19/2014 02:07  Ref. Range 01/19/2014 01:12  WBC Latest Range:  4.0-10.5 K/uL 18.3 (H)  RBC Latest Range: 3.87-5.11 MIL/uL 3.88  Hemoglobin Latest Range: 12.0-15.0 g/dL 78.211.6 (L)    Assessment and Plan  A:  Postoperative Endometritis    P:  Discussed with Dr Rana SnareLowe       Rx Augmentin x 2 weeks       Continue pain meds as needed        Followup in office  [facility] LLCWILLIAMS,Nattalie Santiesteban 01/19/2014, 1:04 AM

## 2014-01-19 NOTE — Discharge Instructions (Signed)
Endometritis Endometritis is an irritation, soreness, and swelling (inflammation) of the lining of the uterus (endometrium).  CAUSES   Bacterial infections.  Sexually transmitted infections (STIs).  Having a miscarriage or childbirth, especially after a long labor or cesarean delivery.  Certain gynecological procedures (such as dilation and curettage, hysteroscopy, or contraceptive insertion). SIGNS AND SYMPTOMS   Fever.  Lower abdominal or pelvic pain.  Abnormal vaginal discharge or bleeding.  Abdominal bloating (distention) or swelling.  General discomfort or ill feeling.  Discomfort with bowel movements. DIAGNOSIS  A physical and pelvic exam are performed. Other tests may include:  Cultures from the cervix.  Blood tests.  Examining a tissue sample of the uterine lining (endometrial biopsy).  Examining discharge under a microscope (wet prep).  Laparoscopy. TREATMENT  Antibiotic medicines are usually given. Other treatments may include:  Fluids through an IV tube inserted in your vein.  Rest. HOME CARE INSTRUCTIONS   Take over-the-counter or prescription medicines for pain, discomfort, or fever as directed by your health care provider.  Take your antibiotics as directed. Finish them even if you start to feel better.  Resume your normal diet and activities as directed or as tolerated.  Do not douche or have sexual intercourse until your health care provider says it is okay.  Do not have sexual intercourse until your partner has been treated if your endometritis is caused by an STI. SEEK IMMEDIATE MEDICAL CARE IF:   You have swelling or increasing pain in the abdomen.  You have a fever.  You have bad smelling vaginal discharge, or you have an increased amount of discharge.  You have abnormal vaginal bleeding.  Your medicine is not helping with the pain.  You experience any problems that may be related to the medicine you are taking.  You have nausea  and vomiting, or you cannot keep foods down.  You have pain with bowel movements. MAKE SURE YOU:   Understand these instructions.  Will watch your condition.  Will get help right away if you are not doing well or get worse. Document Released: 12/16/2000 Document Revised: 08/24/2012 Document Reviewed: 07/21/2012 Exit[employer] Patient Information 2015 [employer]. This information is not intended to replace advice given to you by your health care provider. Make sure you discuss any questions you have with your health care provider.  

## 2014-06-19 ENCOUNTER — Other Ambulatory Visit: Payer: Self-pay

## 2014-06-19 DIAGNOSIS — Z1231 Encounter for screening mammogram for malignant neoplasm of breast: Secondary | ICD-10-CM

## 2014-07-24 ENCOUNTER — Other Ambulatory Visit: Payer: Self-pay | Admitting: Obstetrics and Gynecology

## 2014-07-24 ENCOUNTER — Ambulatory Visit
Admission: RE | Admit: 2014-07-24 | Discharge: 2014-07-24 | Disposition: A | Discharge status: Home / Self Care | Payer: BLUE CROSS/BLUE SHIELD | Source: Ambulatory Visit | Attending: Obstetrics and Gynecology | Admitting: Obstetrics and Gynecology

## 2014-07-24 DIAGNOSIS — N6320 Unspecified lump in the left breast, unspecified quadrant: Secondary | ICD-10-CM

## 2014-10-05 ENCOUNTER — Ambulatory Visit: Payer: BLUE CROSS/BLUE SHIELD

## 2014-11-15 ENCOUNTER — Ambulatory Visit
Admission: RE | Admit: 2014-11-15 | Discharge: 2014-11-15 | Disposition: A | Discharge status: Home / Self Care | Payer: BLUE CROSS/BLUE SHIELD | Source: Ambulatory Visit

## 2014-11-15 DIAGNOSIS — Z1231 Encounter for screening mammogram for malignant neoplasm of breast: Secondary | ICD-10-CM

## 2015-01-06 HISTORY — PX: BREAST BIOPSY: SHX20

## 2015-09-11 ENCOUNTER — Other Ambulatory Visit: Payer: Self-pay | Admitting: Obstetrics and Gynecology

## 2015-09-11 DIAGNOSIS — Z1231 Encounter for screening mammogram for malignant neoplasm of breast: Secondary | ICD-10-CM

## 2015-11-19 ENCOUNTER — Ambulatory Visit: Payer: BLUE CROSS/BLUE SHIELD

## 2015-11-21 ENCOUNTER — Ambulatory Visit
Admission: RE | Admit: 2015-11-21 | Discharge: 2015-11-21 | Disposition: A | Discharge status: Home / Self Care | Payer: BLUE CROSS/BLUE SHIELD | Source: Ambulatory Visit | Attending: Obstetrics and Gynecology | Admitting: Obstetrics and Gynecology

## 2015-11-21 ENCOUNTER — Other Ambulatory Visit: Payer: Self-pay | Admitting: Obstetrics and Gynecology

## 2015-11-21 DIAGNOSIS — Z1231 Encounter for screening mammogram for malignant neoplasm of breast: Secondary | ICD-10-CM

## 2015-11-27 ENCOUNTER — Other Ambulatory Visit: Payer: Self-pay | Admitting: Obstetrics and Gynecology

## 2015-11-27 DIAGNOSIS — R928 Other abnormal and inconclusive findings on diagnostic imaging of breast: Secondary | ICD-10-CM

## 2015-12-04 ENCOUNTER — Ambulatory Visit
Admission: RE | Admit: 2015-12-04 | Discharge: 2015-12-04 | Disposition: A | Discharge status: Home / Self Care | Payer: BLUE CROSS/BLUE SHIELD | Source: Ambulatory Visit | Attending: Obstetrics and Gynecology | Admitting: Obstetrics and Gynecology

## 2015-12-04 ENCOUNTER — Other Ambulatory Visit: Payer: Self-pay | Admitting: Obstetrics and Gynecology

## 2015-12-04 DIAGNOSIS — N6489 Other specified disorders of breast: Secondary | ICD-10-CM

## 2015-12-04 DIAGNOSIS — R928 Other abnormal and inconclusive findings on diagnostic imaging of breast: Secondary | ICD-10-CM

## 2016-01-07 ENCOUNTER — Encounter (INDEPENDENT_AMBULATORY_CARE_PROVIDER_SITE_OTHER): Payer: Self-pay | Admitting: *Deleted

## 2016-01-21 ENCOUNTER — Encounter (INDEPENDENT_AMBULATORY_CARE_PROVIDER_SITE_OTHER): Payer: Self-pay | Admitting: *Deleted

## 2016-01-24 ENCOUNTER — Other Ambulatory Visit (INDEPENDENT_AMBULATORY_CARE_PROVIDER_SITE_OTHER): Payer: Self-pay | Admitting: *Deleted

## 2016-01-24 DIAGNOSIS — Z1211 Encounter for screening for malignant neoplasm of colon: Secondary | ICD-10-CM | POA: Insufficient documentation

## 2016-03-06 ENCOUNTER — Encounter (INDEPENDENT_AMBULATORY_CARE_PROVIDER_SITE_OTHER): Payer: Self-pay | Admitting: *Deleted

## 2016-03-06 ENCOUNTER — Telehealth (INDEPENDENT_AMBULATORY_CARE_PROVIDER_SITE_OTHER): Payer: Self-pay | Admitting: *Deleted

## 2016-03-06 NOTE — Telephone Encounter (Signed)
Patient needs trilyte 

## 2016-03-09 MED ORDER — PEG 3350-KCL-NA BICARB-NACL 420 G PO SOLR: 4000.0000 mL | Freq: Once | ORAL | 0 refills | Status: AC

## 2016-03-24 ENCOUNTER — Telehealth (INDEPENDENT_AMBULATORY_CARE_PROVIDER_SITE_OTHER): Payer: Self-pay | Admitting: *Deleted

## 2016-03-24 NOTE — Telephone Encounter (Signed)
Referring MD/PCP: grewal   Procedure: tcs  Reason/Indication:  screening  Has patient had this procedure before?  no  If so, when, by whom and where?    Is there a family history of colon cancer?  no  Who?  What age when diagnosed?    Is patient diabetic?   no      Does patient have prosthetic heart valve or mechanical valve?  no  Do you have a pacemaker?  no  Has patient ever had endocarditis? no  Has patient had joint replacement within last 12 months?  no  Does patient tend to be constipated or take laxatives? no  Does patient have a history of alcohol/drug use?  no  Is patient on Coumadin, Plavix and/or Aspirin? no  Medications: see epic  Allergies: nkda  Medication Adjustment per Dr Karilyn Cotaehman:   Procedure date & time: 04/22/16 at 930

## 2016-03-24 NOTE — Telephone Encounter (Signed)
agree

## 2016-04-22 ENCOUNTER — Encounter (HOSPITAL_COMMUNITY)
Admission: RE | Disposition: A | Discharge status: Home / Self Care | Payer: Self-pay | Source: Ambulatory Visit | Attending: Internal Medicine

## 2016-04-22 ENCOUNTER — Ambulatory Visit (HOSPITAL_COMMUNITY)
Admission: RE | Admit: 2016-04-22 | Discharge: 2016-04-22 | Disposition: A | Discharge status: Home / Self Care | Payer: BLUE CROSS/BLUE SHIELD | Source: Ambulatory Visit | Attending: Internal Medicine | Admitting: Internal Medicine

## 2016-04-22 ENCOUNTER — Encounter (HOSPITAL_COMMUNITY): Payer: Self-pay

## 2016-04-22 DIAGNOSIS — Z1211 Encounter for screening for malignant neoplasm of colon: Secondary | ICD-10-CM | POA: Diagnosis present

## 2016-04-22 HISTORY — PX: COLONOSCOPY: SHX5424

## 2016-04-22 SURGERY — COLONOSCOPY
Anesthesia: Moderate Sedation

## 2016-04-22 MED ORDER — MEPERIDINE HCL 50 MG/ML IJ SOLN
INTRAMUSCULAR | Status: DC | PRN
  Administered 2016-04-22 (×2): 25 mg via INTRAVENOUS

## 2016-04-22 MED ORDER — MIDAZOLAM HCL 5 MG/5ML IJ SOLN
INTRAMUSCULAR | Status: DC | PRN
  Administered 2016-04-22 (×2): 2 mg via INTRAVENOUS
  Administered 2016-04-22: 1 mg via INTRAVENOUS
  Administered 2016-04-22: 2 mg via INTRAVENOUS

## 2016-04-22 MED ORDER — STERILE WATER FOR IRRIGATION IR SOLN
Status: DC | PRN
  Administered 2016-04-22: 2.5 mL

## 2016-04-22 MED ORDER — MIDAZOLAM HCL 5 MG/5ML IJ SOLN
INTRAMUSCULAR | Status: AC
  Filled 2016-04-22: qty 10

## 2016-04-22 MED ORDER — SODIUM CHLORIDE 0.9 % IV SOLN
INTRAVENOUS | Status: DC
  Administered 2016-04-22: 08:00:00 via INTRAVENOUS

## 2016-04-22 MED ORDER — MEPERIDINE HCL 50 MG/ML IJ SOLN
INTRAMUSCULAR | Status: DC
Start: 2016-04-22 — End: 2016-04-22
  Filled 2016-04-22: qty 1

## 2016-04-22 NOTE — Discharge Instructions (Signed)
Resume usual medications and diet. °No driving for 24 hours. °Next screening exam in 10 years. ° ° °Colonoscopy, Adult, Care After °This sheet gives you information about how to care for yourself after your procedure. Your doctor may also give you more specific instructions. If you have problems or questions, call your doctor. °Follow these instructions at home: °General instructions ° °· For the first 24 hours after the procedure: °? Do not drive or use machinery. °? Do not sign important documents. °? Do not drink alcohol. °? Do your daily activities more slowly than normal. °? Eat foods that are soft and easy to digest. °? Rest often. °· Take over-the-counter or prescription medicines only as told by your doctor. °· It is up to you to get the results of your procedure. Ask your doctor, or the department performing the procedure, when your results will be ready. °To help cramping and bloating: °· Try walking around. °· Put heat on your belly (abdomen) as told by your doctor. Use a heat source that your doctor recommends, such as a moist heat pack or a heating pad. °? Put a towel between your skin and the heat source. °? Leave the heat on for 20-30 minutes. °? Remove the heat if your skin turns bright red. This is especially important if you cannot feel pain, heat, or cold. You can get burned. °Eating and drinking °· Drink enough fluid to keep your pee (urine) clear or pale yellow. °· Return to your normal diet as told by your doctor. Avoid heavy or fried foods that are hard to digest. °· Avoid drinking alcohol for as long as told by your doctor. °Contact a doctor if: °· You have blood in your poop (stool) 2-3 days after the procedure. °Get help right away if: °· You have more than a small amount of blood in your poop. °· You see large clumps of tissue (blood clots) in your poop. °· Your belly is swollen. °· You feel sick to your stomach (nauseous). °· You throw up (vomit). °· You have a fever. °· You have belly  pain that gets worse, and medicine does not help your pain. °This information is not intended to replace advice given to you by your health care provider. Make sure you discuss any questions you have with your health care provider. °Document Released: 01/24/2010 Document Revised: 09/16/2015 Document Reviewed: 09/16/2015 °Elsevier Interactive Patient Education © 2017 [employer] Inc. ° °

## 2016-04-22 NOTE — H&P (Signed)
Betty Harvey is an 51 y.o. female.   Chief Complaint: Patient is here for colonoscopy. HPI: Patient is 51 year old Caucasian female who is here for screening colonoscopy. She denies abdominal pain change in bowel habits or rectal bleeding. Family History is negative for CRC.  Past Medical History:  Diagnosis Date  . SI (sacroiliac) pain   . UTI (lower urinary tract infection)   . Vaginal delivery 1997, 1999    Past Surgical History:  Procedure Laterality Date  . HYSTEROSCOPY W/D&C N/A 01/10/2014   Procedure: DILATATION AND CURETTAGE /HYSTEROSCOPY;  Surgeon: Jeani Hawking, MD;  Location: [facility];  Service: Gynecology;  Laterality: N/A;  . PILONIDAL CYST EXCISION      History reviewed. No pertinent family history. Social History:  reports that she has never smoked. She has never used smokeless tobacco. She reports that she does not drink alcohol or use drugs.  Allergies: No Known Allergies  Medications Prior to Admission  Medication Sig Dispense Refill  . aspirin-acetaminophen-caffeine (EXCEDRIN MIGRAINE) 250-250-65 MG tablet Take 1 tablet by mouth daily as needed for headache.    . ibuprofen (ADVIL,MOTRIN) 200 MG tablet Take 200 mg by mouth 2 (two) times daily as needed for headache or moderate pain.    . Nutritional Supplements (ESTROVEN PO) Take 1 tablet by mouth daily.    . Progesterone Micronized (PROGESTERONE PO) Take 50 mg by mouth at bedtime.    . temazepam (RESTORIL) 15 MG capsule Take 15 mg by mouth at bedtime as needed for sleep.      No results found for this or any previous visit (from the past 48 hour(s)). No results found.  ROS  Blood pressure 112/64, pulse 73, temperature 98.6 F (37 C), temperature source Oral, resp. rate 12, height  (1.676 m), weight 155 lb (70.3 kg), last menstrual period 04/06/2016, SpO2 100 %. Physical Exam  Constitutional: She appears well-developed and well-nourished.  HENT:  Mouth/Throat: Oropharynx is clear and moist.  Eyes:  Conjunctivae are normal. No scleral icterus.  Neck: No thyromegaly present.  Cardiovascular: Normal rate, regular rhythm and normal heart sounds.   No murmur heard. Respiratory: Effort normal and breath sounds normal.  GI: Soft. She exhibits no distension and no mass. There is no tenderness.  Musculoskeletal: She exhibits no edema.  Lymphadenopathy:    She has no cervical adenopathy.  Neurological: She is alert.  Skin: Skin is warm and dry.     Assessment/Plan Average risk screening colonoscopy.  Lionel December, MD 04/22/2016, 8:22 AM

## 2016-04-22 NOTE — Op Note (Signed)
[facility] Patient Name: Betty Harvey Procedure Date: 04/22/2016 8:12 AM MRN: [id number] Date of Birth: Jul 19, 1965 Attending MD: Lionel December , MD CSN: [id number] Age: 51 Admit Type: Outpatient Procedure:                Colonoscopy Indications:              Screening for colorectal malignant neoplasm Providers:                Lionel December, MD, Nena Polio, RN, Toniann Fail                            RN, RN Referring MD:             Marcelle Overlie, MD Medicines:                Meperidine 50 mg IV, Midazolam 7 mg IV Complications:            No immediate complications. Estimated Blood Loss:     Estimated blood loss: none. Procedure:                Pre-Anesthesia Assessment:                           - Prior to the procedure, a History and Physical                            was performed, and patient medications and                            allergies were reviewed. The patient's tolerance of                            previous anesthesia was also reviewed. The risks                            and benefits of the procedure and the sedation                            options and risks were discussed with the patient.                            All questions were answered, and informed consent                            was obtained. Prior Anticoagulants: The patient                            last took aspirin 1 day prior to the procedure. ASA                            Grade Assessment: I - A normal, healthy patient.                            After reviewing the risks and benefits, the patient  was deemed in satisfactory condition to undergo the                            procedure.                           After obtaining informed consent, the colonoscope                            was passed under direct vision. Throughout the                            procedure, the patient's blood pressure, pulse, and                            oxygen  saturations were monitored continuously. The                            [id number] ([id number]) scope was introduced through                            the anus and advanced to the the cecum, identified                            by appendiceal orifice and ileocecal valve. The                            colonoscopy was performed without difficulty. The                            patient tolerated the procedure well. The quality                            of the bowel preparation was excellent. The                            ileocecal valve, appendiceal orifice, and rectum                            were photographed. Scope In: 8:31:18 AM Scope Out: 8:49:00 AM Scope Withdrawal Time: 0 hours 11 minutes 8 seconds  Total Procedure Duration: 0 hours 17 minutes 42 seconds  Findings:      The perianal and digital rectal examinations were normal.      The colon (entire examined portion) appeared normal.      The retroflexed view of the distal rectum and anal verge was normal and       showed no anal or rectal abnormalities. Impression:               - The entire examined colon is normal.                           - No specimens collected. Moderate Sedation:      Moderate (conscious) sedation was administered by the endoscopy nurse       and supervised by the endoscopist. The  following parameters were       monitored: oxygen saturation, heart rate, blood pressure, CO2       capnography and response to care. Total physician intraservice time was       24 minutes. Recommendation:           - Patient has a contact number available for                            emergencies. The signs and symptoms of potential                            delayed complications were discussed with the                            patient. Return to normal activities tomorrow.                            Written discharge instructions were provided to the                            patient.                           -  Resume previous diet today.                           - Continue present medications.                           - Repeat colonoscopy in 10 years for screening                            purposes. Procedure Code(s):        --- Professional ---                           [id number], Colonoscopy, flexible; diagnostic, including                            collection of specimen(s) by brushing or washing,                            when performed (separate procedure)                           99152, Moderate sedation services provided by the                            same physician or other qualified health care                            professional performing the diagnostic or                            therapeutic service that the sedation supports,  requiring the presence of an independent trained                            observer to assist in the monitoring of the                            patient's level of consciousness and physiological                            status; initial 15 minutes of intraservice time,                            patient age 63 years or older                           [phone], Moderate sedation services; each additional                            15 minutes intraservice time Diagnosis Code(s):        --- Professional ---                           Z12.11, Encounter for screening for malignant                            neoplasm of colon CPT copyright 2016 American Medical Association. All rights reserved. The codes documented in this report are preliminary and upon coder review may  be revised to meet current compliance requirements. Lionel December, MD Lionel December, MD 04/22/2016 8:53:47 AM This report has been signed electronically. Number of Addenda: 0

## 2016-04-24 ENCOUNTER — Encounter (HOSPITAL_COMMUNITY): Payer: Self-pay | Admitting: Internal Medicine

## 2016-06-26 ENCOUNTER — Other Ambulatory Visit: Payer: Self-pay | Admitting: Obstetrics and Gynecology

## 2016-06-26 DIAGNOSIS — R921 Mammographic calcification found on diagnostic imaging of breast: Secondary | ICD-10-CM

## 2016-07-07 ENCOUNTER — Ambulatory Visit
Admission: RE | Admit: 2016-07-07 | Discharge: 2016-07-07 | Disposition: A | Discharge status: Home / Self Care | Payer: BLUE CROSS/BLUE SHIELD | Source: Ambulatory Visit | Attending: Obstetrics and Gynecology | Admitting: Obstetrics and Gynecology

## 2016-07-07 DIAGNOSIS — R921 Mammographic calcification found on diagnostic imaging of breast: Secondary | ICD-10-CM

## 2016-09-22 ENCOUNTER — Other Ambulatory Visit: Payer: Self-pay | Admitting: Obstetrics and Gynecology

## 2016-09-22 DIAGNOSIS — Z1231 Encounter for screening mammogram for malignant neoplasm of breast: Secondary | ICD-10-CM

## 2016-11-23 ENCOUNTER — Ambulatory Visit
Admission: RE | Admit: 2016-11-23 | Discharge: 2016-11-23 | Disposition: A | Discharge status: Home / Self Care | Payer: BLUE CROSS/BLUE SHIELD | Source: Ambulatory Visit | Attending: Obstetrics and Gynecology | Admitting: Obstetrics and Gynecology

## 2016-11-23 DIAGNOSIS — Z1231 Encounter for screening mammogram for malignant neoplasm of breast: Secondary | ICD-10-CM

## 2017-10-22 ENCOUNTER — Other Ambulatory Visit: Payer: Self-pay | Admitting: Obstetrics and Gynecology

## 2017-10-22 DIAGNOSIS — Z1231 Encounter for screening mammogram for malignant neoplasm of breast: Secondary | ICD-10-CM

## 2017-11-26 ENCOUNTER — Ambulatory Visit
Admission: RE | Admit: 2017-11-26 | Discharge: 2017-11-26 | Disposition: A | Discharge status: Home / Self Care | Payer: BLUE CROSS/BLUE SHIELD | Source: Ambulatory Visit | Attending: Obstetrics and Gynecology | Admitting: Obstetrics and Gynecology

## 2017-11-26 DIAGNOSIS — Z1231 Encounter for screening mammogram for malignant neoplasm of breast: Secondary | ICD-10-CM

## 2017-11-30 ENCOUNTER — Other Ambulatory Visit: Payer: Self-pay | Admitting: Obstetrics and Gynecology

## 2017-11-30 DIAGNOSIS — R928 Other abnormal and inconclusive findings on diagnostic imaging of breast: Secondary | ICD-10-CM

## 2017-12-07 ENCOUNTER — Other Ambulatory Visit: Payer: Self-pay | Admitting: Obstetrics and Gynecology

## 2017-12-07 ENCOUNTER — Ambulatory Visit
Admission: RE | Admit: 2017-12-07 | Discharge: 2017-12-07 | Disposition: A | Discharge status: Home / Self Care | Payer: BLUE CROSS/BLUE SHIELD | Source: Ambulatory Visit | Attending: Obstetrics and Gynecology | Admitting: Obstetrics and Gynecology

## 2017-12-07 DIAGNOSIS — R928 Other abnormal and inconclusive findings on diagnostic imaging of breast: Secondary | ICD-10-CM

## 2017-12-08 ENCOUNTER — Other Ambulatory Visit: Payer: Self-pay | Admitting: Obstetrics and Gynecology

## 2017-12-08 DIAGNOSIS — N6002 Solitary cyst of left breast: Secondary | ICD-10-CM

## 2018-06-09 ENCOUNTER — Ambulatory Visit
Admission: RE | Admit: 2018-06-09 | Discharge: 2018-06-09 | Disposition: A | Discharge status: Home / Self Care | Payer: BLUE CROSS/BLUE SHIELD | Source: Ambulatory Visit | Attending: Obstetrics and Gynecology | Admitting: Obstetrics and Gynecology

## 2018-06-09 ENCOUNTER — Other Ambulatory Visit: Payer: Self-pay

## 2018-06-09 ENCOUNTER — Other Ambulatory Visit: Payer: Self-pay | Admitting: Obstetrics and Gynecology

## 2018-06-09 DIAGNOSIS — N6002 Solitary cyst of left breast: Secondary | ICD-10-CM

## 2018-12-05 ENCOUNTER — Other Ambulatory Visit: Payer: Self-pay | Admitting: Obstetrics and Gynecology

## 2018-12-05 ENCOUNTER — Ambulatory Visit
Admission: RE | Admit: 2018-12-05 | Discharge: 2018-12-05 | Disposition: A | Discharge status: Home / Self Care | Payer: BC Managed Care – PPO | Source: Ambulatory Visit | Attending: Obstetrics and Gynecology | Admitting: Obstetrics and Gynecology

## 2018-12-05 ENCOUNTER — Other Ambulatory Visit: Payer: Self-pay

## 2018-12-05 ENCOUNTER — Ambulatory Visit
Admission: RE | Admit: 2018-12-05 | Discharge: 2018-12-05 | Disposition: A | Discharge status: Home / Self Care | Payer: BLUE CROSS/BLUE SHIELD | Source: Ambulatory Visit | Attending: Obstetrics and Gynecology | Admitting: Obstetrics and Gynecology

## 2018-12-05 DIAGNOSIS — N6002 Solitary cyst of left breast: Secondary | ICD-10-CM

## 2018-12-05 DIAGNOSIS — R928 Other abnormal and inconclusive findings on diagnostic imaging of breast: Secondary | ICD-10-CM

## 2019-10-02 ENCOUNTER — Other Ambulatory Visit: Payer: Self-pay | Admitting: Obstetrics and Gynecology

## 2019-10-02 DIAGNOSIS — Z1231 Encounter for screening mammogram for malignant neoplasm of breast: Secondary | ICD-10-CM

## 2019-12-06 ENCOUNTER — Other Ambulatory Visit: Payer: Self-pay

## 2019-12-06 ENCOUNTER — Ambulatory Visit
Admission: RE | Admit: 2019-12-06 | Discharge: 2019-12-06 | Disposition: A | Discharge status: Home / Self Care | Payer: BC Managed Care – PPO | Source: Ambulatory Visit | Attending: Obstetrics and Gynecology | Admitting: Obstetrics and Gynecology

## 2019-12-06 DIAGNOSIS — Z1231 Encounter for screening mammogram for malignant neoplasm of breast: Secondary | ICD-10-CM

## 2020-10-30 ENCOUNTER — Other Ambulatory Visit: Payer: Self-pay | Admitting: Obstetrics and Gynecology

## 2020-10-30 DIAGNOSIS — Z1231 Encounter for screening mammogram for malignant neoplasm of breast: Secondary | ICD-10-CM

## 2020-12-06 ENCOUNTER — Ambulatory Visit: Payer: BC Managed Care – PPO

## 2020-12-23 ENCOUNTER — Ambulatory Visit
Admission: RE | Admit: 2020-12-23 | Discharge: 2020-12-23 | Disposition: A | Discharge status: Home / Self Care | Payer: BC Managed Care – PPO | Source: Ambulatory Visit | Attending: Obstetrics and Gynecology | Admitting: Obstetrics and Gynecology

## 2020-12-23 DIAGNOSIS — Z1231 Encounter for screening mammogram for malignant neoplasm of breast: Secondary | ICD-10-CM

## 2020-12-26 ENCOUNTER — Other Ambulatory Visit: Payer: Self-pay | Admitting: Obstetrics and Gynecology

## 2020-12-26 DIAGNOSIS — R928 Other abnormal and inconclusive findings on diagnostic imaging of breast: Secondary | ICD-10-CM

## 2021-01-15 ENCOUNTER — Ambulatory Visit: Payer: BC Managed Care – PPO

## 2021-01-15 ENCOUNTER — Ambulatory Visit
Admission: RE | Admit: 2021-01-15 | Discharge: 2021-01-15 | Disposition: A | Discharge status: Home / Self Care | Payer: BC Managed Care – PPO | Source: Ambulatory Visit | Attending: Obstetrics and Gynecology | Admitting: Obstetrics and Gynecology

## 2021-01-15 DIAGNOSIS — R928 Other abnormal and inconclusive findings on diagnostic imaging of breast: Secondary | ICD-10-CM

## 2021-02-04 ENCOUNTER — Other Ambulatory Visit: Payer: BC Managed Care – PPO

## 2021-04-17 ENCOUNTER — Ambulatory Visit: Payer: BC Managed Care – PPO | Admitting: Family Medicine

## 2021-05-19 ENCOUNTER — Encounter: Payer: Self-pay | Admitting: Family Medicine

## 2021-05-19 ENCOUNTER — Ambulatory Visit: Payer: BC Managed Care – PPO | Admitting: Family Medicine

## 2021-05-19 VITALS — BP 110/60 | HR 94 | Temp 98.1°F | Ht 66.5 in | Wt 171.2 lb

## 2021-05-19 DIAGNOSIS — Z23 Encounter for immunization: Secondary | ICD-10-CM | POA: Diagnosis not present

## 2021-05-19 DIAGNOSIS — K219 Gastro-esophageal reflux disease without esophagitis: Secondary | ICD-10-CM | POA: Diagnosis not present

## 2021-05-19 DIAGNOSIS — F419 Anxiety disorder, unspecified: Secondary | ICD-10-CM

## 2021-05-19 NOTE — Progress Notes (Signed)
? ?Subjective:  ? ? ? Patient ID: Betty Harvey, female    DOB: [birth year], 56 y.o.   MRN: [id number] ? ?Chief Complaint  ?Patient presents with  ? Establish Care  ? ? ?HPI ?To est PCP ? ADD-saw psych-[facility].  Takes meds during week. Doing well.  ?Anxiety-tried wellbutrin, etc. All occurred during perimenopause.   But pristiq and adderall-works really well.  No SI.  Also on HRT.  ?Menopause-a lot of changes-stable now on current meds. Managed by gyn ? ?Tdap-can't recall. Never had shingrix.  In March-LDL "elevated". A1C 5.8.  ? ? ?Health Maintenance Due  ?Topic Date Due  ? HIV Screening  Never done  ? Hepatitis C Screening  Never done  ? ? ?Past Medical History:  ?Diagnosis Date  ? Anxiety   ? SI (sacroiliac) pain   ? UTI (lower urinary tract infection)   ? Vaginal delivery 1997, 1999  ? ? ?Past Surgical History:  ?Procedure Laterality Date  ? BREAST BIOPSY Right 2017  ? COLONOSCOPY N/A 04/22/2016  ? Procedure: COLONOSCOPY;  Surgeon: Malissa Hippo, MD;  Location: [facility];  Service: Endoscopy;  Laterality: N/A;  930  ? HYSTEROSCOPY WITH D & C N/A 01/10/2014  ? Procedure: DILATATION AND CURETTAGE /HYSTEROSCOPY;  Surgeon: Jeani Hawking, MD;  Location: [facility];  Service: Gynecology;  Laterality: N/A;  ? PILONIDAL CYST EXCISION    ? ? ?Outpatient Medications Prior to Visit  ?Medication Sig Dispense Refill  ? amphetamine-dextroamphetamine (ADDERALL) 10 MG tablet dextroamphetamine-amphetamine 10 mg tablet    ? Cholecalciferol (VITAMIN D3 PO) 1,000 Units daily.    ? desvenlafaxine (PRISTIQ) 50 MG 24 hr tablet Take 50 mg by mouth daily.    ? estradiol (VIVELLE-DOT) 0.05 MG/24HR patch Place 1 patch onto the skin 2 (two) times a week.    ? ibuprofen (ADVIL,MOTRIN) 200 MG tablet Take 200 mg by mouth 2 (two) times daily as needed for headache or moderate pain.    ? Menaquinone-7 (K2 PO) Take 100 mcg by mouth.    ? PROGESTERONE PO Take 100 mg by mouth daily.    ? temazepam (RESTORIL) 15 MG capsule Take by mouth.     ? aspirin-acetaminophen-caffeine (EXCEDRIN MIGRAINE) 250-250-65 MG tablet Take 1 tablet by mouth daily as needed for headache.    ? Nutritional Supplements (ESTROVEN PO) Take 1 tablet by mouth daily.    ? Progesterone Micronized (PROGESTERONE PO) Take 50 mg by mouth at bedtime.    ? temazepam (RESTORIL) 15 MG capsule Take 15 mg by mouth at bedtime as needed for sleep.    ? ?No facility-administered medications prior to visit.  ? ? ?Allergies  ?Allergen Reactions  ? No Known Allergies   ? ?ROS neg/noncontributory except as noted HPI/below ?ROS: ?Gen: no fever, chills  ?Skin: no rash, itching ?ENT: no ear pain, ear drainage, nasal congestion, rhinorrhea, sinus pressure, sore throat ?Eyes: no blurry vision, double vision ?Resp: no cough, wheeze,SOB ?CV: no CP, palpitations, LE edema,  ?GI: no heartburn, n/v/d/c, abd pain.  Occ dysphagia past sev months and burning in throat if lying flat. Dad in hosp since 3/14 so a lot of stress and not eating/exercising regularly. Marland Kitchen ?GU: no dysuria, urgency, frequency, hematuria ?MSK: no joint pain, myalgias, back pain ?Neuro: no dizziness, headache, weakness, vertigo ?Psych: no depression,  insomnia, SI  ? ? ?   ?Objective:  ?  ? ?BP 110/60   Pulse 94   Temp 98.1 ?F (36.7 ?C) (Temporal)  Ht 5' 6.5" (1.689 m)   Wt 171 lb 4 oz (77.7 kg)   SpO2 97%   BMI 27.23 kg/m?  ?Wt Readings from Last 3 Encounters:  ?05/19/21 171 lb 4 oz (77.7 kg)  ?04/22/16 155 lb (70.3 kg)  ?01/19/14 153 lb 2 oz (69.5 kg)  ? ? ?Physical Exam  ? ?Gen: WDWN NAD wf ?HEENT: NCAT, conjunctiva not injected, sclera nonicteric ?TM WNL B- some wax, OP moist, no exudates  ?NECK:  supple, no thyromegaly, no nodes, no carotid bruits ?CARDIAC: RRR, S1S2+, no murmur. DP 2+B ?LUNGS: CTAB. No wheezes ?ABDOMEN:  BS+, soft, NTND, No HSM, no masses ?EXT:  no edema ?MSK: no gross abnormalities.  ?NEURO: A&O x3.  CN II-XII intact.  ?PSYCH: normal mood. Good eye contact ? ?Reviewed labs from Dr Melchor Amour. And note ? ?    ?Assessment & Plan:  ? ?Problem List Items Addressed This Visit   ? ?  ? Other  ? Anxiety  ? Relevant Medications  ? desvenlafaxine (PRISTIQ) 50 MG 24 hr tablet  ? ?Other Visit Diagnoses   ? ? Gastroesophageal reflux disease without esophagitis    -  Primary  ? ?  ? GERD-some dysphagia.  will do omeprazole otc for 1 mo.  Work on [employer].  If worse, no change, let me know.   ?Anxiety-chronic.  Set off during perimenopause.  Stable/controlled.  Cont pristiq 50mg , estradiol patch, progesterone.  Try to wean adderall to see if helps or not.  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 ?No orders of the defined types were placed in this encounter. ? ? ? , MD ? ?

## 2021-05-19 NOTE — Patient Instructions (Addendum)
Welcome to [facility]! It was a pleasure meeting you today. ? ?As discussed, Please schedule a 12 month follow up visit today. ? ?B complex vitamin.  Omeprazole for 1 month.   Adderall 1/2 tablet daily.  Exercise daily.  ? ?PLEASE NOTE: ? ?If you had any LAB tests please let us know if you have not heard back within a few days. You may see your results on MyChart before we have a chance to review them but we will give you a call once they are reviewed by [place]. If we ordered any REFERRALS today, please let us know if you have not heard from their office within the next week.  ?Let us know through MyChart if you are needing REFILLS, or have your pharmacy send [place] the request. You can also use MyChart to communicate with me or any office staff. ? ?Please try these tips to maintain a healthy lifestyle: ? ?Eat most of your calories during the day when you are active. Eliminate processed foods including packaged sweets (pies, cakes, cookies), reduce intake of potatoes, white bread, white pasta, and white rice. Look for whole grain options, oat flour or almond flour. ? ?Each meal should contain half fruits/vegetables, one quarter protein, and one quarter carbs (no bigger than a computer mouse). ? ?Cut down on sweet beverages. This includes juice, soda, and sweet tea. Also watch fruit intake, though this is a healthier sweet option, it still contains natural sugar! Limit to 3 servings daily. ? ?Drink at least 1 glass of water with each meal and aim for at least 8 glasses per day ? ?Exercise at least 150 minutes every week.   ?

## 2021-07-15 ENCOUNTER — Ambulatory Visit: Payer: BC Managed Care – PPO

## 2021-08-14 ENCOUNTER — Ambulatory Visit: Payer: BC Managed Care – PPO

## 2021-08-21 ENCOUNTER — Ambulatory Visit (INDEPENDENT_AMBULATORY_CARE_PROVIDER_SITE_OTHER): Payer: BC Managed Care – PPO | Admitting: *Deleted

## 2021-08-21 DIAGNOSIS — Z23 Encounter for immunization: Secondary | ICD-10-CM

## 2021-08-21 NOTE — Progress Notes (Signed)
Per orders of Dr. Ruthine Dose, injection of 2nd Shingle vaccine  given in left deltoid per patient preference by Jobe Gibbon, CMA. Patient tolerated injection well.

## 2021-09-29 ENCOUNTER — Encounter: Payer: Self-pay | Admitting: *Deleted

## 2021-11-13 ENCOUNTER — Other Ambulatory Visit: Payer: Self-pay | Admitting: Obstetrics and Gynecology

## 2021-11-13 DIAGNOSIS — Z1231 Encounter for screening mammogram for malignant neoplasm of breast: Secondary | ICD-10-CM

## 2021-11-19 ENCOUNTER — Telehealth: Payer: Self-pay | Admitting: Family Medicine

## 2021-11-19 NOTE — Telephone Encounter (Signed)
Spoke to patient and she stated that she has declined to go to ER right now, she was just getting home and in comfy clothes, the nearest ER is 40 minutes away. She stated that husband would be home soon and if she feel she need to go, she will. Patient denies dizziness/lightheadedness. Patient stated that stool is watery and she notices a lot of blood in the toilet, there is also blood when she wipe. PCP is aware and advised patient get scheduled at office for tomorrow, 11/20/21 with someone that has an opening. Patient advised to go to ER if sx get worse. Patient transferred to [facility] to get scheduled.

## 2021-11-19 NOTE — Telephone Encounter (Signed)
Please see message below and advise.

## 2021-11-19 NOTE — Telephone Encounter (Signed)
Patient Name: Betty Harvey Gender: Female DOB: October 09, 1965 Age: 56 Y 5 D Return Phone Number: [phone] (Primary), [phone] (Secondary) Address: City/ State/ Zip: [facility] [place] [facility], [facility] [place] Contact Type Call Who Is Calling Patient / Member / Family / Caregiver Call Type Triage / Clinical Relationship To Patient Self Return Phone Number [phone] (Primary) Chief Complaint Blood In Stool Reason for Call Symptomatic / Request for Health Information Initial Comment caller states pt has blood in her stool Translation No Nurse Assessment Nurse: Smith Mince, RN, Maxine Glenn Date/Time (Eastern Time): 11/19/2021 3:44:35 PM Confirm and document reason for call. If symptomatic, describe symptoms. ---Caller states she had blood in her stool about 4-5 times today. Also reports some abdominal cramping. Does the patient have any new or worsening symptoms? ---Yes Will a triage be completed? ---Yes Related visit to physician within the last 2 weeks? ---No Does the PT have any chronic conditions? (i.e. diabetes, asthma, this includes High risk factors for pregnancy, etc.) ---No Is this a behavioral health or substance abuse call? ---No Guidelines Guideline Title Affirmed Question Affirmed Notes Nurse Date/Time (Eastern Time) Diarrhea [1] Blood in the stool AND [2] moderate or large amount of blood Birder Robson, [facility] 11/19/2021 3:46:33 PM Disp. Time Lamount Cohen Time) Disposition Final User 11/19/2021 3:33:42 PM Attempt made - message left Birder Robson, [facility] 11/19/2021 3:49:40 PM Go to ED Now Yes Smith Mince, RN, [facility] Final Disposition 11/19/2021 3:49:40 PM Go to ED Now Yes Smith Mince, RN, [place] PLEASE NOTE: All timestamps contained within this report are represented as [place] Standard Time. CONFIDENTIALTY NOTICE: This fax transmission is intended only for the addressee. It contains information that is  legally privileged, confidential or otherwise protected from use or disclosure. If you are not the intended recipient, you are strictly prohibited from reviewing, disclosing, copying using or disseminating any of this information or taking any action in reliance on or regarding this information. If you have received this fax in error, please notify us immediately by telephone so that we can arrange for its return to [place]. Phone: [phone], Toll-Free: [phone], Fax: [phone] Page: 2 of 2 Call Id: [id number] Caller Disagree/Comply Disagree Caller Understands Yes PreDisposition InappropriateToAsk Care Advice Given Per Guideline GO TO ED NOW: * You need to be seen in the Emergency Department. * Go to the ED at ___________ Hospital. * Leave now. Drive carefully. CARE ADVICE given per Diarrhea (Adult) guideline. Referrals GO TO FACILITY REFUSED

## 2021-11-19 NOTE — Telephone Encounter (Signed)
Patient states: - She went to the restroom today and noticed blood upon wiping and in the toilet bowl - She has been experiencing stomach cramping and nausea  - Every time she has made a BM today, she has seen bright red blood  - There is no pain in anal region or other sx  - Pt was recently prescribed augmentin after visiting UC on 11/08 for a head cold. States she stopped medication about 3-5 days later due to it causing nausea.

## 2021-11-20 ENCOUNTER — Ambulatory Visit: Payer: BC Managed Care – PPO | Admitting: Family

## 2021-11-20 ENCOUNTER — Other Ambulatory Visit: Payer: Self-pay

## 2021-11-20 ENCOUNTER — Encounter: Payer: Self-pay | Admitting: Family

## 2021-11-20 VITALS — BP 104/70 | HR 95 | Temp 97.7°F | Ht 66.5 in | Wt 163.6 lb

## 2021-11-20 DIAGNOSIS — R197 Diarrhea, unspecified: Secondary | ICD-10-CM

## 2021-11-20 DIAGNOSIS — R1084 Generalized abdominal pain: Secondary | ICD-10-CM

## 2021-11-20 MED ORDER — DICYCLOMINE HCL 20 MG PO TABS: 20.0000 mg | ORAL_TABLET | Freq: Three times a day (TID) | ORAL | 0 refills | Status: DC

## 2021-11-20 NOTE — Patient Instructions (Addendum)
     PLEASE NOTE:  If you had any lab tests please let us know if you have not heard back within a few days. You may see your results on MyChart before we have a chance to review them but we will give you a call once they are reviewed by us. If we ordered any referrals today, please let us know if you have not heard from their office within the next week.    

## 2021-11-20 NOTE — Progress Notes (Signed)
Patient ID: Betty Harvey, female    DOB: 13-Mar-1965, 56 y.o.   MRN: [id number]  Chief Complaint  Patient presents with   Blood In Stools    Pt states she started medication on 11/8 called augmentin, and 11/14 started having stomach pain so she stopped medication, and then yesterday blood in stool, pt woke up at 4am yesterday, with diarreha, pt went to the bathroom at 7am and blood was in stool, lasted all day, along with stomach cramps. Pt went to bathroom this morning  at  2am-6am it was still blood in stool and then from 6am-7am stool is now completely black.     HPI:      GI sx:  started Augmentin last Wed for sinus, but had been having mild nausea, loss of appetite- started feeling bad Saturday, with nausea, no vomiting, able to eat ok, very loose, watery diarrhea started yesterday with blood in toilet, today she reports stomach cramping and bloody stool early in am, then turned to black and tarry stool. Reports taking Prilosec for 2 weeks for heartburn, indigestion. Denies hx of ulcers. Last colonoscopy 6 years ago, normal. Reports feeling weak, slight dizziness early in am.       Assessment & Plan:  1. Bloody diarrhea suspect possible C-diff infection vs peptic/duodenal ulcer, pt instructed on how to obtain a stool specimen and bring back today and we can order STAT testing. Contact precautions reviewed with pt to prevent spread of possible infection, handout provided.  2. Generalized abdominal pain sending Bentyl, advised on use & SE, and to not start until after she obtains a stool sample for testing.  - dicyclomine (BENTYL) 20 MG tablet; Take 1 tablet (20 mg total) by mouth 3 (three) times daily before meals.  Dispense: 30 tablet; Refill: 0   Subjective:    Outpatient Medications Prior to Visit  Medication Sig Dispense Refill   amphetamine-dextroamphetamine (ADDERALL) 10 MG tablet dextroamphetamine-amphetamine 10 mg tablet     Cholecalciferol (VITAMIN D3 PO) 1,000 Units  daily.     desvenlafaxine (PRISTIQ) 50 MG 24 hr tablet Take 50 mg by mouth daily.     estradiol (VIVELLE-DOT) 0.05 MG/24HR patch Place 1 patch onto the skin 2 (two) times a week.     ibuprofen (ADVIL,MOTRIN) 200 MG tablet Take 200 mg by mouth 2 (two) times daily as needed for headache or moderate pain.     Menaquinone-7 (K2 PO) Take 100 mcg by mouth.     PROGESTERONE PO Take 100 mg by mouth daily.     temazepam (RESTORIL) 15 MG capsule Take by mouth.     No facility-administered medications prior to visit.   Past Medical History:  Diagnosis Date   Anxiety    SI (sacroiliac) pain    UTI (lower urinary tract infection)    Vaginal delivery 1997, 1999   Past Surgical History:  Procedure Laterality Date   BREAST BIOPSY Right 2017   COLONOSCOPY N/A 04/22/2016   Procedure: COLONOSCOPY;  Surgeon: Rogene Houston, MD;  Location: [facility];  Service: Endoscopy;  Laterality: N/A;  930   HYSTEROSCOPY WITH D & C N/A 01/10/2014   Procedure: DILATATION AND CURETTAGE /HYSTEROSCOPY;  Surgeon: Cyril Mourning, MD;  Location: [facility];  Service: Gynecology;  Laterality: N/A;   PILONIDAL CYST EXCISION     Allergies  Allergen Reactions   No Known Allergies       Objective:    Physical Exam Vitals and nursing note reviewed.  Constitutional:  Appearance: Normal appearance.  Cardiovascular:     Rate and Rhythm: Normal rate and regular rhythm.  Pulmonary:     Effort: Pulmonary effort is normal.     Breath sounds: Normal breath sounds.  Musculoskeletal:        General: Normal range of motion.  Skin:    General: Skin is warm and dry.  Neurological:     Mental Status: She is alert.  Psychiatric:        Mood and Affect: Mood normal.        Behavior: Behavior normal.    BP 104/70 (BP Location: Left Arm, Patient Position: Sitting)   Pulse 95   Temp 97.7 F (36.5 C) (Temporal)   Ht 5' 6.5" (1.689 m)   Wt 163 lb 9.6 oz (74.2 kg)   LMP 10/20/2021   SpO2 98%   BMI 26.01 kg/m  Wt  Readings from Last 3 Encounters:  11/20/21 163 lb 9.6 oz (74.2 kg)  05/19/21 171 lb 4 oz (77.7 kg)  04/22/16 155 lb (70.3 kg)       Dulce Sellar, NP

## 2021-11-22 LAB — CLOSTRIDIUM DIFFICILE BY PCR: Toxigenic C. Difficile by PCR: NEGATIVE

## 2021-11-23 NOTE — Progress Notes (Signed)
Good news, the C-diff testing of your stool is negative. How are you feeling? Did the Bentyl help your symptoms, or no? Is your stool still black & tarry?  If still having symptoms, I will send over a GI referral. Let me know, thanks.

## 2021-11-24 ENCOUNTER — Telehealth: Payer: Self-pay | Admitting: Family Medicine

## 2021-11-24 NOTE — Telephone Encounter (Signed)
Patient called in regards to lab results from 11/21/21. Pt requests a callback to explain results. Informed pt that while she waits for a call to explain results, she could find results in her Mychart. Pt verbalized understanding and will try to access mychart in the meantime.

## 2021-11-24 NOTE — Telephone Encounter (Signed)
Lvm for pt in regards to labs. Pt viewed lab through MyChart.

## 2021-12-24 ENCOUNTER — Ambulatory Visit
Admission: RE | Admit: 2021-12-24 | Discharge: 2021-12-24 | Disposition: A | Discharge status: Home / Self Care | Payer: BC Managed Care – PPO | Source: Ambulatory Visit | Attending: Obstetrics and Gynecology | Admitting: Obstetrics and Gynecology

## 2021-12-24 DIAGNOSIS — Z1231 Encounter for screening mammogram for malignant neoplasm of breast: Secondary | ICD-10-CM

## 2022-05-21 ENCOUNTER — Ambulatory Visit (INDEPENDENT_AMBULATORY_CARE_PROVIDER_SITE_OTHER): Payer: BC Managed Care – PPO | Admitting: Family Medicine

## 2022-05-21 ENCOUNTER — Encounter: Payer: Self-pay | Admitting: Family Medicine

## 2022-05-21 VITALS — BP 100/60 | HR 77 | Temp 97.5°F | Resp 16 | Ht 66.5 in | Wt 170.5 lb

## 2022-05-21 DIAGNOSIS — Z0000 Encounter for general adult medical examination without abnormal findings: Secondary | ICD-10-CM | POA: Diagnosis not present

## 2022-05-21 NOTE — Patient Instructions (Signed)

## 2022-05-21 NOTE — Progress Notes (Signed)
Phone [phone]   Subjective:   Patient is a 57 y.o. female presenting for annual physical.    Chief Complaint  Patient presents with   Annual Exam    CPE Not fasting    1  annual-exercising. - walking.   See problem oriented charting- ROS- ROS: Gen: no fever, chills  Skin: no rash, itching ENT: no ear pain, ear drainage, nasal congestion, rhinorrhea, sinus pressure, sore throat Eyes: no blurry vision, double vision Resp: no cough, wheeze,SOB CV: no CP, palpitations, LE edema,  GI: no heartburn, n/v/d/c, abd pain GU: no dysuria, urgency, frequency, hematuria MSK: no joint pain, myalgias, back pain Neuro: no dizziness, headache, weakness, vertigo Psych: no depression, anxiety, insomnia, SI   The following were reviewed and entered/updated in epic: Past Medical History:  Diagnosis Date   Anxiety    SI (sacroiliac) pain    UTI (lower urinary tract infection)    Vaginal delivery 1997, 1999   Patient Active Problem List   Diagnosis Date Noted   Anxiety 05/19/2021   Special screening for malignant neoplasms, colon 01/24/2016   Past Surgical History:  Procedure Laterality Date   BREAST BIOPSY Right 2017   COLONOSCOPY N/A 04/22/2016   Procedure: COLONOSCOPY;  Surgeon: Malissa Hippo, MD;  Location: [facility];  Service: Endoscopy;  Laterality: N/A;  930   HYSTEROSCOPY WITH D & C N/A 01/10/2014   Procedure: DILATATION AND CURETTAGE /HYSTEROSCOPY;  Surgeon: Jeani Hawking, MD;  Location: [facility];  Service: Gynecology;  Laterality: N/A;   PILONIDAL CYST EXCISION      Family History  Problem Relation Age of Onset   Hypertension Mother    Hearing loss Mother    Arthritis Mother    Hyperlipidemia Father    Hearing loss Father    Arthritis Father     Medications- reviewed and updated Current Outpatient Medications  Medication Sig Dispense Refill   Cholecalciferol (VITAMIN D3 PO) 1,000 Units daily.     desvenlafaxine (PRISTIQ) 50 MG 24 hr tablet Take 50 mg by  mouth daily.     estradiol (VIVELLE-DOT) 0.05 MG/24HR patch Place 1 patch onto the skin 2 (two) times a week.     ibuprofen (ADVIL,MOTRIN) 200 MG tablet Take 200 mg by mouth 2 (two) times daily as needed for headache or moderate pain.     metroNIDAZOLE (METROGEL) 0.75 % gel Apply 1 Application topically at bedtime.     Multiple Vitamin (MULTIVITAMIN) capsule Take 1 capsule by mouth daily.     PROGESTERONE PO Take 100 mg by mouth daily.     temazepam (RESTORIL) 15 MG capsule Take 1 capsule by mouth at bedtime.     triamcinolone cream (KENALOG) 0.1 % as needed.     No current facility-administered medications for this visit.    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 DP 2+B LUNGS: CTAB.  No wheezes ABDOMEN:  BS+, soft, NTND, No HSM, no masses EXT:  no edema MSK: no gross abnormalities. MS 5/5 all 4 NEURO: A&O x3.  CN II-XII intact.  PSYCH: normal mood. Good eye contact   Reviewed labs from Dr. Vincente Poli    Assessment and Plan   Health Maintenance counseling: 1. Anticipatory guidance: Patient counseled regarding regular dental exams q6 months, eye exams,  avoiding smoking and second hand smoke, limiting alcohol to 1 beverage per day, no illicit drugs.   2. Risk factor reduction:  Advised patient of need for regular exercise and diet rich and fruits and vegetables to reduce risk of heart attack and stroke. Exercise- +.  Wt Readings from Last 3 Encounters:  05/21/22 170  lb 8 oz (77.3 kg)  11/20/21 163 lb 9.6 oz (74.2 kg)  05/19/21 171 lb 4 oz (77.7 kg)   3. Immunizations/screenings/ancillary studies Immunization History  Administered Date(s) Administered   Influenza,inj,Quad PF,6+ Mos 10/17/2020   Tdap 05/19/2021   Unspecified SARS-COV-2 Vaccination 02/06/2019, 03/13/2019   Zoster Recombinat (Shingrix) 05/19/2021, 08/21/2021   Health Maintenance Due  Topic Date Due   HIV Screening  Never done   Hepatitis C Screening  Never done    4. Cervical cancer screening- UTD 5. Breast cancer screening-  mammogram UTD 6. Colon cancer screening - UTD 7. Skin cancer screening- advised regular sunscreen use. Denies worrisome, changing, or new skin lesions.  8. Birth control/STD check- n/a 9. Osteoporosis screening- n/a 10. Smoking associated screening - non smoker  Wellness examination   Wellness-[facility] guidance     Recommended follow up: Return in about 1 year (around 05/21/2023) for annual physical.  Lab/Order associations:n/a fasting  Angelena Sole, MD

## 2022-11-05 ENCOUNTER — Other Ambulatory Visit: Payer: Self-pay | Admitting: Obstetrics and Gynecology

## 2022-11-05 DIAGNOSIS — Z1231 Encounter for screening mammogram for malignant neoplasm of breast: Secondary | ICD-10-CM

## 2022-12-28 ENCOUNTER — Ambulatory Visit
Admission: RE | Admit: 2022-12-28 | Discharge: 2022-12-28 | Disposition: A | Discharge status: Home / Self Care | Payer: BC Managed Care – PPO | Source: Ambulatory Visit | Attending: Obstetrics and Gynecology | Admitting: Obstetrics and Gynecology

## 2022-12-28 DIAGNOSIS — Z1231 Encounter for screening mammogram for malignant neoplasm of breast: Secondary | ICD-10-CM

## 2023-01-05 ENCOUNTER — Encounter: Payer: Self-pay | Admitting: Obstetrics and Gynecology

## 2023-01-05 ENCOUNTER — Other Ambulatory Visit: Payer: Self-pay | Admitting: Obstetrics and Gynecology

## 2023-01-05 DIAGNOSIS — R928 Other abnormal and inconclusive findings on diagnostic imaging of breast: Secondary | ICD-10-CM

## 2023-02-03 ENCOUNTER — Other Ambulatory Visit: Payer: BC Managed Care – PPO

## 2023-02-24 ENCOUNTER — Other Ambulatory Visit: Payer: BC Managed Care – PPO

## 2023-03-03 ENCOUNTER — Encounter: Payer: Self-pay | Admitting: Family Medicine

## 2023-03-08 ENCOUNTER — Ambulatory Visit
Admission: RE | Admit: 2023-03-08 | Discharge: 2023-03-08 | Disposition: A | Discharge status: Home / Self Care | Payer: BC Managed Care – PPO | Source: Ambulatory Visit | Attending: Obstetrics and Gynecology | Admitting: Obstetrics and Gynecology

## 2023-03-08 DIAGNOSIS — R928 Other abnormal and inconclusive findings on diagnostic imaging of breast: Secondary | ICD-10-CM

## 2023-03-22 ENCOUNTER — Other Ambulatory Visit: Payer: BC Managed Care – PPO

## 2023-04-23 LAB — HM PAP SMEAR

## 2023-05-04 LAB — LAB REPORT - SCANNED
A1c: 5.5
EGFR: 94

## 2023-05-24 ENCOUNTER — Encounter: Payer: Self-pay | Admitting: Family Medicine

## 2023-05-24 ENCOUNTER — Ambulatory Visit (INDEPENDENT_AMBULATORY_CARE_PROVIDER_SITE_OTHER): Payer: BC Managed Care – PPO | Admitting: Family Medicine

## 2023-05-24 VITALS — BP 114/69 | HR 84 | Temp 97.5°F | Resp 16 | Ht 66.5 in | Wt 167.5 lb

## 2023-05-24 DIAGNOSIS — Z0000 Encounter for general adult medical examination without abnormal findings: Secondary | ICD-10-CM

## 2023-05-24 NOTE — Progress Notes (Signed)
 Phone [phone]   Subjective:   Patient is a 58 y.o. female presenting for annual physical.    Chief Complaint  Patient presents with   Annual Exam    CPE Not fasting, had blood work done with [employer].  Gyn Dr. Catharine Clock month ago-did labs.  LDL was elevated some.  A1C improved to 5.5 per pt. Retiring in June  See problem oriented charting- ROS- ROS: Gen: no fever, chills  Skin: no rash, itching ENT: no ear pain, ear drainage, nasal congestion, rhinorrhea, sinus pressure, sore throat Eyes: no blurry vision, double vision Resp: no cough, wheeze,SOB CV: no CP, palpitations, LE edema,  GI: no heartburn, n/v/d/c,  was having some RLQ pain and saw gyn.  Did U/S-all ok. Increased progesterone to 200mg .  Pain resolved but some bleeding so adjusting HRT.  Constipation better on mg citrate GU: no dysuria, urgency, frequency, hematuria MSK: no joint pain, myalgias, back pain Neuro: no dizziness, headache, weakness, vertigo Psych: no depression, anxiety, insomnia, SI   The following were reviewed and entered/updated in epic: Past Medical History:  Diagnosis Date   Anxiety    SI (sacroiliac) pain    UTI (lower urinary tract infection)    Vaginal delivery 1997, 1999   Patient Active Problem List   Diagnosis Date Noted   Anxiety 05/19/2021   Special screening for malignant neoplasms, colon 01/24/2016   Past Surgical History:  Procedure Laterality Date   BREAST BIOPSY Right 2017   COLONOSCOPY N/A 04/22/2016   Procedure: COLONOSCOPY;  Surgeon: Ruby Corporal, MD;  [facility]: [facility];  Service: Endoscopy;  Laterality: N/A;  930   HYSTEROSCOPY WITH D & C N/A 01/10/2014   Procedure: DILATATION AND CURETTAGE /HYSTEROSCOPY;  Surgeon: Martine Sleek, MD;  Location: [facility];  Service: Gynecology;  Laterality: N/A;   PILONIDAL CYST EXCISION      Family History  Problem Relation Age of Onset   Hypertension Mother    Hearing loss Mother    Arthritis Mother     Hyperlipidemia Father    Hearing loss Father    Arthritis Father    BRCA 1/2 Neg Hx    Breast cancer Neg Hx     Medications- reviewed and updated Current Outpatient Medications  Medication Sig Dispense Refill   B Complex Vitamins (VITAMIN B COMPLEX PO)      Cholecalciferol (VITAMIN D3 PO)      desvenlafaxine (PRISTIQ) 50 MG 24 hr tablet Take 50 mg by mouth daily.     estradiol (VIVELLE-DOT) 0.05 MG/24HR patch Place 1 patch onto the skin 2 (two) times a week.     ibuprofen (ADVIL,MOTRIN) 200 MG tablet Take 200 mg by mouth 2 (two) times daily as needed for headache or moderate pain.     metroNIDAZOLE (METROGEL) 0.75 % gel Apply 1 Application topically at bedtime.     Multiple Vitamin (MULTIVITAMIN) capsule Take 1 capsule by mouth daily.     Omega-3 Fatty Acids (FISH OIL PO)      progesterone (PROMETRIUM) 100 MG capsule Take 200 mg by mouth daily.     temazepam (RESTORIL) 15 MG capsule Take 1 capsule by mouth at bedtime.     triamcinolone cream (KENALOG) 0.1 % as needed.     No current facility-administered medications for this visit.    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 DP 2+B LUNGS: CTAB. No wheezes ABDOMEN:  BS+, soft, NTND, No HSM, no masses EXT:  no edema MSK: no gross abnormalities. MS 5/5 all 4 NEURO: A&O x3.  CN II-XII intact.  PSYCH: normal mood. Good eye contact  Ganglion L wrist radial side    Assessment and Plan    Health Maintenance counseling: 1. Anticipatory guidance: Patient counseled regarding regular dental exams q6 months, eye exams,  avoiding smoking and second hand smoke, limiting alcohol to 1 beverage per day, no illicit drugs.   2. Risk factor reduction:  Advised patient of need for regular exercise and diet rich and fruits and vegetables to reduce risk of heart attack and stroke. Exercise- +.  Wt Readings from Last 3 Encounters:  05/24/23 167 lb 8 oz (76 kg)  05/21/22 170 lb 8 oz (77.3 kg)  11/20/21 163 lb 9.6 oz (74.2 kg)   3. Immunizations/screenings/ancillary studies Immunization History  Administered Date(s) Administered   Influenza,inj,Quad PF,6+ Mos 10/17/2020   Tdap 05/19/2021   Unspecified SARS-COV-2 Vaccination 02/06/2019, 03/13/2019   Zoster Recombinant(Shingrix ) 05/19/2021, 08/21/2021   Health Maintenance Due  Topic Date Due   HIV Screening  Never done   Hepatitis C Screening  Never done   Cervical Cancer Screening (HPV/Pap Cotest)  Never done    4. Cervical cancer screening- utd 5. Breast cancer screening-  mammogram utd 6. Colon cancer screening - utd 7. Skin cancer screening- advised regular sunscreen use. Denies worrisome, changing, or new skin lesions.  8. Birth control/STD check- n/a 9. Osteoporosis screening- n/a 10. Smoking associated screening - non smoker  Wellness examination   Wellness-[facility] guidance  will get copy labs from gyn  Recommended follow up: Return in about 1 year (around 05/23/2024) for annual physical.  Lab/Order associations:n/a fasting  Ellsworth Haas, MD

## 2023-05-24 NOTE — Patient Instructions (Signed)

## 2023-05-25 ENCOUNTER — Ambulatory Visit: Payer: Self-pay | Admitting: Family Medicine

## 2023-05-25 NOTE — Progress Notes (Signed)
 Labs look good

## 2023-06-14 ENCOUNTER — Ambulatory Visit: Payer: Self-pay | Admitting: Family Medicine

## 2023-06-14 ENCOUNTER — Ambulatory Visit: Admitting: Family Medicine

## 2023-06-14 ENCOUNTER — Encounter: Payer: Self-pay | Admitting: Family Medicine

## 2023-06-14 ENCOUNTER — Telehealth: Payer: Self-pay

## 2023-06-14 VITALS — BP 98/56 | HR 86 | Temp 97.0°F | Ht 66.5 in | Wt 167.0 lb

## 2023-06-14 DIAGNOSIS — M2550 Pain in unspecified joint: Secondary | ICD-10-CM

## 2023-06-14 DIAGNOSIS — R509 Fever, unspecified: Secondary | ICD-10-CM | POA: Diagnosis not present

## 2023-06-14 LAB — CBC WITH DIFFERENTIAL/PLATELET
Basophils Absolute: 0 10*3/uL (ref 0.0–0.1)
Basophils Relative: 0.8 % (ref 0.0–3.0)
Eosinophils Absolute: 0.3 10*3/uL (ref 0.0–0.7)
Eosinophils Relative: 5.5 % — ABNORMAL HIGH (ref 0.0–5.0)
HCT: 39.2 % (ref 36.0–46.0)
Hemoglobin: 13.4 g/dL (ref 12.0–15.0)
Lymphocytes Relative: 21.3 % (ref 12.0–46.0)
Lymphs Abs: 1 10*3/uL (ref 0.7–4.0)
MCHC: 34.1 g/dL (ref 30.0–36.0)
MCV: 86.6 fl (ref 78.0–100.0)
Monocytes Absolute: 0.5 10*3/uL (ref 0.1–1.0)
Monocytes Relative: 9.8 % (ref 3.0–12.0)
Neutro Abs: 3.1 10*3/uL (ref 1.4–7.7)
Neutrophils Relative %: 62.6 % (ref 43.0–77.0)
Platelets: 228 10*3/uL (ref 150.0–400.0)
RBC: 4.53 Mil/uL (ref 3.87–5.11)
RDW: 12.7 % (ref 11.5–15.5)
WBC: 4.9 10*3/uL (ref 4.0–10.5)

## 2023-06-14 LAB — SEDIMENTATION RATE: Sed Rate: 35 mm/h — ABNORMAL HIGH (ref 0–30)

## 2023-06-14 LAB — POC COVID19 BINAXNOW: SARS Coronavirus 2 Ag: NEGATIVE

## 2023-06-14 MED ORDER — DOXYCYCLINE HYCLATE 100 MG PO TABS: 100.0000 mg | ORAL_TABLET | Freq: Two times a day (BID) | ORAL | 0 refills | Status: DC

## 2023-06-14 NOTE — Patient Instructions (Signed)
 Continue tylenol , ibuprofen  Doxycycline

## 2023-06-14 NOTE — Telephone Encounter (Signed)
 Called and spoke with pt about lab results and instructions per Dr Waldo Guitar. Pt acknowledged understanding with no questions or concerns.

## 2023-06-14 NOTE — Progress Notes (Signed)
 White count is ok.  Eosinophils slightly elevated-usually allergies Of course, sed rate is elevated so don't know if infection, pain, etc.  See how doxy does-if still some pain, can consider prednisone-keep me informed of progress

## 2023-06-14 NOTE — Progress Notes (Signed)
 Subjective:     Patient ID: Betty Harvey, female    DOB: Sep 06, 1965, 58 y.o.   MRN: [id number]  Chief Complaint  Patient presents with   Headache    Started out as body and joint achy-ness a couple of weeks ago and then Friday night started having chills and fever. Went away Saturday but then started back up last night. No other symptoms. No congestion. No sore throat. Just fatigue.    HPI Discussed the use of AI scribe software for clinical note transcription with the patient, who gave verbal consent to proceed.  History of Present Illness Betty Harvey "[name]" is a 58 year old female who presents with fever, body aches, and headache.  She has been experiencing achy hands for a few weeks, initially attributing it to weather changes. Two weeks ago, she experienced a shooting pain in her right leg while walking barefoot, which improved with different footwear. The pain later evolved into a sensation similar to shin splints, affecting her ankle on the medial side. Despite these symptoms, she was able to walk for an hour without issues.  Over the past weekend, she developed body aches, chills, and fever, initially without checking her temperature. She experienced a headache that was unresponsive to medication and felt 'yucky' throughout Saturday. Her fever was confirmed at 101.86F on Saturday night. She experienced night sweats, waking up drenched, and continued to have a throbbing headache. No sore throat, respiratory symptoms, chest pain, gastrointestinal symptoms, urinary issues, or visual disturbances. She has a history of a bug bite on her right leg but did not observe a bull's eye rash or tick removal.  She is currently taking hormones, with a recent dosage change in estrogen and progesterone, but has not missed any doses. She has been taking Tylenol  and Advil to manage her symptoms, noting that the headache persists as a dull ache.  She is retiring at the end of the month and has  been active with tasks such as weed eating at her mother's house.    Health Maintenance Due  Topic Date Due   HIV Screening  Never done   Hepatitis C Screening  Never done   COVID-19 Vaccine (3 - Mixed Product risk series) 04/10/2019    Past Medical History:  Diagnosis Date   Anxiety    SI (sacroiliac) pain    UTI (lower urinary tract infection)    Vaginal delivery 1997, 1999    Past Surgical History:  Procedure Laterality Date   BREAST BIOPSY Right 2017   COLONOSCOPY N/A 04/22/2016   Procedure: COLONOSCOPY;  Surgeon: Ruby Corporal, MD;  [facility];  Service: Endoscopy;  Laterality: N/A;  930   HYSTEROSCOPY WITH D & C N/A 01/10/2014   Procedure: DILATATION AND CURETTAGE /HYSTEROSCOPY;  Surgeon: Martine Sleek, MD;  Location: [facility];  Service: Gynecology;  Laterality: N/A;   PILONIDAL CYST EXCISION       Current Outpatient Medications:    B Complex Vitamins (VITAMIN B COMPLEX PO), , Disp: , Rfl:    Cholecalciferol (VITAMIN D3 PO), , Disp: , Rfl:    desvenlafaxine (PRISTIQ) 50 MG 24 hr tablet, Take 50 mg by mouth daily., Disp: , Rfl:    doxycycline (VIBRA-TABS) 100 MG tablet, Take 1 tablet (100 mg total) by mouth 2 (two) times daily., Disp: 20 tablet, Rfl: 0   estradiol (VIVELLE-DOT) 0.0375 MG/24HR, Place 1 patch onto the skin 2 (two) times a week., Disp: , Rfl:  ibuprofen (ADVIL,MOTRIN) 200 MG tablet, Take 200 mg by mouth 2 (two) times daily as needed for headache or moderate pain., Disp: , Rfl:    metroNIDAZOLE (METROGEL) 0.75 % gel, Apply 1 Application topically at bedtime., Disp: , Rfl:    Multiple Vitamin (MULTIVITAMIN) capsule, Take 1 capsule by mouth daily., Disp: , Rfl:    Omega-3 Fatty Acids (FISH OIL PO), , Disp: , Rfl:    progesterone (PROMETRIUM) 100 MG capsule, Take 200 mg by mouth daily., Disp: , Rfl:    temazepam (RESTORIL) 15 MG capsule, Take 1 capsule by mouth at bedtime., Disp: , Rfl:    triamcinolone cream (KENALOG) 0.1 %, as needed., Disp: ,  Rfl:   Allergies  Allergen Reactions   No Known Allergies    ROS neg/noncontributory except as noted HPI/below      Objective:      BP (!) 98/56   Pulse 86   Temp (!) 97 F (36.1 C)   Ht 5' 6.5" (1.689 m)   Wt 167 lb (75.8 kg)   LMP 05/19/2023 (Exact Date)   SpO2 98%   BMI 26.55 kg/m  Wt Readings from Last 3 Encounters:  06/14/23 167 lb (75.8 kg)  05/24/23 167 lb 8 oz (76 kg)  05/21/22 170 lb 8 oz (77.3 kg)    Physical Exam   Gen: WDWN NAD HEENT: NCAT, conjunctiva not injected, sclera nonicteric NECK:  supple, no thyromegaly, no nodes, no carotid bruits CARDIAC: RRR, S1S2+, no murmur.  LUNGS: CTAB. No wheezes ABDOMEN:  BS+, soft, NTND, No HSM, no masses EXT:  no edema MSK: no gross abnormalities. No swollen/tender joints.  Min ttp R medial knee NEURO: A&O x3.  CN II-XII intact.  PSYCH: normal mood. Good eye contact  Results for orders placed or performed in visit on 06/14/23  POC COVID-19   Collection Time: 06/14/23 11:51 AM  Result Value Ref Range   SARS Coronavirus 2 Ag Negative Negative       Assessment & Plan:  Fever, unspecified fever cause -     POC COVID-19 BinaxNow -     CBC with Differential/Platelet -     Sedimentation rate  Polyarthralgia  Other orders -     Doxycycline Hyclate; Take 1 tablet (100 mg total) by mouth 2 (two) times daily.  Dispense: 20 tablet; Refill: 0  Assessment and Plan Assessment & Plan Fever and Myalgia   She presents with recent onset of fever, chills, myalgia, and headache, severe enough to disrupt sleep. Differential diagnosis includes viral infection, bacterial infection, or rheumatological condition. A recent hormone therapy change is noted but not believed to be the cause. Consideration of tick-borne illness is due to a recent bug bite, but there is no bull's-eye rash or known tick exposure. Absence of respiratory, gastrointestinal, or urinary symptoms makes viral or bacterial infection less likely. Considering  viral syndrome or tick-borne illness such as Lyme disease. Doxycycline is discussed for bacterial causes, including tick-borne illnesses, with caution regarding photosensitivity. Order CBC and ESR to assess for infection or inflammation. Prescribe doxycycline for 10 days as a precaution against tick-borne illness, with instructions to avoid sun exposure due to photosensitivity. Advise continued use of acetaminophen  and ibuprofen for fever and pain management. Instruct to monitor symptoms and report any new or worsening symptoms, such as rash or respiratory issues.  Foot Pain and Tendonitis   She reports intermittent shooting pain in the right leg, initially noticed while walking barefoot. Pain is described as similar to shin splints  and localized to the medial side of the ankle. Tenderness along the tendon suggests tendonitis. Pain improved with different footwear and is not currently present. Possible connection between foot pain and recent bug bite is uncertain. Advise on performing flexion and extension exercises for the foot to alleviate tendonitis. Monitor for any changes in the foot pain or development of new symptoms.  Hormone Therapy Adjustment   She is on hormone therapy with a recent dosage adjustment. Estrogen dose decreased, which may contribute to night sweats but is unlikely to cause fever. Menopausal women often experience aches and pains that improve with estrogen therapy, suggesting the dosage change might be related to some symptoms. Discussed potential need to adjust dosage if symptoms persist. Monitor symptoms to determine if they correlate with the hormone dosage change. Consider discussing with the prescribing physician if symptoms persist or worsen.  Follow-up   She lives an hour away and has a busy schedule, including caring for her husband who is getting a pacemaker. Acknowledged inconvenience of frequent visits and suggests a pragmatic approach to follow-up. Discussed options for  managing symptoms and follow-up care, including potential for blood work if symptoms persist. Advise to follow up if symptoms do not improve in a few days or if new symptoms develop. Consider returning for blood work if symptoms persist or worsen.    Return if symptoms worsen or fail to improve.  Ellsworth Haas, MD

## 2023-11-12 ENCOUNTER — Ambulatory Visit: Admitting: Family Medicine

## 2023-11-12 VITALS — BP 122/70 | HR 76 | Temp 97.0°F | Ht 66.5 in | Wt 167.0 lb

## 2023-11-12 DIAGNOSIS — R1319 Other dysphagia: Secondary | ICD-10-CM

## 2023-11-12 DIAGNOSIS — Z23 Encounter for immunization: Secondary | ICD-10-CM | POA: Diagnosis not present

## 2023-11-12 DIAGNOSIS — K219 Gastro-esophageal reflux disease without esophagitis: Secondary | ICD-10-CM | POA: Diagnosis not present

## 2023-11-12 MED ORDER — OMEPRAZOLE 40 MG PO CPDR: 40.0000 mg | DELAYED_RELEASE_CAPSULE | Freq: Two times a day (BID) | ORAL | 1 refills | Status: AC

## 2023-11-12 NOTE — Patient Instructions (Addendum)
 It was very nice to see you today!  Take the meds for 2-3 wks, no change, let us  know.  If improved, continue for 8 weeks and then can stop.  If recurs, restart, but let me know.  Twice daily omeprazole for 2-3 weeks, then can go to once/day   PLEASE NOTE:  If you had any lab tests please let us  know if you have not heard back within a few days. You may see your results on MyChart before we have a chance to review them but we will give you a call once they are reviewed by us . If we ordered any referrals today, please let us  know if you have not heard from their office within the next week.   Please try these tips to maintain a healthy lifestyle:  Eat most of your calories during the day when you are active. Eliminate processed foods including packaged sweets (pies, cakes, cookies), reduce intake of potatoes, white bread, white pasta, and white rice. Look for whole grain options, oat flour or almond flour.  Each meal should contain half fruits/vegetables, one quarter protein, and one quarter carbs (no bigger than a computer mouse).  Cut down on sweet beverages. This includes juice, soda, and sweet tea. Also watch fruit intake, though this is a healthier sweet option, it still contains natural sugar! Limit to 3 servings daily.  Drink at least 1 glass of water  with each meal and aim for at least 8 glasses per day  Exercise at least 150 minutes every week.

## 2023-11-12 NOTE — Progress Notes (Signed)
 Subjective:     Patient ID: Betty Harvey, female    DOB: 06-30-1965, 58 y.o.   MRN: [id number]  Chief Complaint  Patient presents with   Gastroesophageal Reflux    Pt states it is miserable, she watches what she eats and sometimes feels likes its a lump in her throat     Discussed the use of AI scribe software for clinical note transcription with the patient, who gave verbal consent to proceed.  History of Present Illness Betty Harvey is a 58 year old female who presents with persistent gastroesophageal reflux disease (GERD) symptoms.  She has been experiencing persistent symptoms of GERD despite previous treatments. Initially, she started experiencing heartburn and reflux symptoms about a year ago and took omeprazole for 1 month in May 2023, which provided relief. However, the symptoms returned >22yr later. She repeated the treatment in February 2025 and September 2025, each time experiencing temporary relief, but the symptoms recurred   Her symptoms include heartburn and a sensation of food getting stuck when swallowing solids, although she does not experience choking. This sensation is not constant and seems to be more noticeable with dry foods like bread, rice, and meats. She also experiences acidity when bending over or tying her shoes, typically occurring about an hour after eating.  She has attempted dietary modifications, such as reducing citrus and spicy foods, and has been sleeping with her head elevated. She avoids eating late and large meals. Despite these efforts, she continues to experience symptoms. She has been using Tums occasionally for relief but not on a daily basis.  No dark, tarry stools, vomiting, or pain, and her symptoms do not wake her up at night. She last completed a course of omeprazole at the end of September. She has used omeprazole in the past for symptom relief and occasionally uses Tums, but not on a daily basis.    Health Maintenance Due   Topic Date Due   HIV Screening  Never done   Hepatitis C Screening  Never done    Past Medical History:  Diagnosis Date   Anxiety    SI (sacroiliac) pain    UTI (lower urinary tract infection)    Vaginal delivery 1997, 1999    Past Surgical History:  Procedure Laterality Date   BREAST BIOPSY Right 2017   COLONOSCOPY N/A 04/22/2016   Procedure: COLONOSCOPY;  Surgeon: Claudis RAYMOND Rivet, MD;  Location: [facility];  Service: Endoscopy;  Laterality: N/A;  930   HYSTEROSCOPY WITH D & C N/A 01/10/2014   Procedure: DILATATION AND CURETTAGE /HYSTEROSCOPY;  Surgeon: Rosaline LITTIE Cobble, MD;  Location: [facility];  Service: Gynecology;  Laterality: N/A;   PILONIDAL CYST EXCISION       Current Outpatient Medications:    B Complex Vitamins (VITAMIN B COMPLEX PO), , Disp: , Rfl:    Cholecalciferol (VITAMIN D3 PO), , Disp: , Rfl:    desvenlafaxine (PRISTIQ) 50 MG 24 hr tablet, Take 50 mg by mouth daily., Disp: , Rfl:    estradiol (VIVELLE-DOT) 0.0375 MG/24HR, Place 1 patch onto the skin 2 (two) times a week., Disp: , Rfl:    ibuprofen (ADVIL,MOTRIN) 200 MG tablet, Take 200 mg by mouth 2 (two) times daily as needed for headache or moderate pain., Disp: , Rfl:    metroNIDAZOLE (METROGEL) 0.75 % gel, Apply 1 Application topically at bedtime., Disp: , Rfl:    Multiple Vitamin (MULTIVITAMIN) capsule, Take 1 capsule by mouth daily., Disp: , Rfl:  Omega-3 Fatty Acids (FISH OIL PO), , Disp: , Rfl:    omeprazole (PRILOSEC) 40 MG capsule, Take 1 capsule (40 mg total) by mouth in the morning and at bedtime., Disp: 180 capsule, Rfl: 1   progesterone (PROMETRIUM) 100 MG capsule, Take 200 mg by mouth daily., Disp: , Rfl:    temazepam (RESTORIL) 15 MG capsule, Take 1 capsule by mouth at bedtime., Disp: , Rfl:    triamcinolone cream (KENALOG) 0.1 %, as needed., Disp: , Rfl:   Allergies  Allergen Reactions   No Known Allergies    ROS neg/noncontributory except as noted HPI/below      Objective:     BP  122/70 (BP Location: Left Arm, Patient Position: Sitting)   Pulse 76   Temp (!) 97 F (36.1 C) (Temporal)   Ht 5' 6.5 (1.689 m)   Wt 167 lb (75.8 kg)   LMP  (LMP Unknown)   SpO2 98%   BMI 26.55 kg/m  Wt Readings from Last 3 Encounters:  11/12/23 167 lb (75.8 kg)  06/14/23 167 lb (75.8 kg)  05/24/23 167 lb 8 oz (76 kg)    Physical Exam GENERAL: Well developed, well nourished, no acute distress. HEAD EYES EARS NOSE THROAT: Normocephalic, atraumatic, conjunctiva not injected, sclera nonicteric, pharynx normal. CARDIAC: Regular rate and rhythm, S1 S2 present, no murmur, NECK: Supple, no thyromegaly, no nodes, no carotid bruits. LUNGS: Clear to auscultation bilaterally, no wheezes. ABDOMEN: Bowel sounds present, soft, non-tender, non-distended, no hepatosplenomegaly, no masses. EXTREMITIES: No edema. MUSCULOSKELETAL: No gross abnormalities. NEUROLOGICAL: Alert and oriented x3, cranial nerves II through XII intact. PSYCHIATRIC: Normal mood, good eye contact.       Assessment & Plan:  Gastroesophageal reflux disease without esophagitis -     H. pylori breath test  Other dysphagia -     H. pylori breath test  Encounter for immunization -     Flu vaccine trivalent PF, 6mos and older(Flulaval,Afluria,Fluarix,Fluzone)  Other orders -     Omeprazole; Take 1 capsule (40 mg total) by mouth in the morning and at bedtime.  Dispense: 180 capsule; Refill: 1    Assessment and Plan Assessment & Plan Gastroesophageal reflux disease with dysphagia to solids intermitt Recurrent GERD with dysphagia to solids, previously managed with omeprazole, recurs after stopping medication. Possible H. pylori infection is considered due to the recurrent nature of symptoms. There are no alarming symptoms such as melena, emesis, or nocturnal symptoms. Dysphagia suggests potential esophageal irritation or damage. Prescribe omeprazole twice daily for 2-3 weeks, then reduce to once daily for 8 weeks if  symptoms improve. Order H. pylori breath test. Advise dietary modifications: avoid citrus, spicy foods, and large meals; elevate head of bed; avoid late meals. Instruct to chew food thoroughly and take sips of water  while eating to aid swallowing. Advise reporting if symptoms worsen or do not improve in 2-3 weeks. Discuss potential need for GI referral if symptoms persist or worsen.      Return if symptoms worsen or fail to improve.  Jenkins CHRISTELLA Carrel, MD

## 2023-11-15 LAB — H. PYLORI BREATH TEST: H. pylori Breath Test: NOT DETECTED

## 2023-11-16 ENCOUNTER — Ambulatory Visit: Payer: Self-pay | Admitting: Family Medicine

## 2023-11-16 NOTE — Progress Notes (Signed)
 No H pylori infection.  Continue with plan as we discussed

## 2023-11-26 ENCOUNTER — Other Ambulatory Visit: Payer: Self-pay | Admitting: Obstetrics and Gynecology

## 2023-11-26 DIAGNOSIS — Z1231 Encounter for screening mammogram for malignant neoplasm of breast: Secondary | ICD-10-CM

## 2023-12-31 ENCOUNTER — Ambulatory Visit
Admission: RE | Admit: 2023-12-31 | Discharge: 2023-12-31 | Disposition: A | Discharge status: Home / Self Care | Source: Ambulatory Visit | Attending: Obstetrics and Gynecology | Admitting: Obstetrics and Gynecology

## 2023-12-31 DIAGNOSIS — Z1231 Encounter for screening mammogram for malignant neoplasm of breast: Secondary | ICD-10-CM

## 2024-05-24 ENCOUNTER — Encounter: Admitting: Family Medicine
